# Patient Record
Sex: Female | Born: 2000
Health system: Southern US, Community
[De-identification: ages and names within clinical notes are randomized; demographics above are authoritative.]

## PROBLEM LIST (undated history)

## (undated) DIAGNOSIS — Z87440 Personal history of urinary (tract) infections: Secondary | ICD-10-CM

## (undated) DIAGNOSIS — R3129 Other microscopic hematuria: Secondary | ICD-10-CM

## (undated) DIAGNOSIS — J050 Acute obstructive laryngitis [croup]: Secondary | ICD-10-CM

## (undated) HISTORY — DX: Personal history of urinary (tract) infections: Z87.440

## (undated) HISTORY — DX: Other microscopic hematuria: R31.29

## (undated) HISTORY — PX: WISDOM TOOTH EXTRACTION: SHX21

## (undated) HISTORY — DX: Acute obstructive laryngitis (croup): J05.0

---
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
## 2020-06-10 DIAGNOSIS — Z8659 Personal history of other mental and behavioral disorders: Secondary | ICD-10-CM

## 2020-06-10 HISTORY — DX: Personal history of other mental and behavioral disorders: Z86.59

## 2020-07-03 ENCOUNTER — Encounter: Payer: Self-pay | Admitting: Family Medicine

## 2020-07-03 ENCOUNTER — Telehealth: Payer: Self-pay

## 2020-07-03 ENCOUNTER — Emergency Department
Admission: EM | Admit: 2020-07-03 | Discharge: 2020-07-04 | Disposition: A | Discharge status: Other Acute Inpatient Hospital | Payer: BC Managed Care – PPO | Attending: Emergency Medicine | Admitting: Emergency Medicine

## 2020-07-03 ENCOUNTER — Ambulatory Visit (INDEPENDENT_AMBULATORY_CARE_PROVIDER_SITE_OTHER): Payer: BC Managed Care – PPO | Admitting: Family Medicine

## 2020-07-03 ENCOUNTER — Other Ambulatory Visit: Payer: Self-pay

## 2020-07-03 VITALS — BP 118/76 | HR 94 | Temp 98.2°F | Ht 66.0 in | Wt 185.3 lb

## 2020-07-03 DIAGNOSIS — Z046 Encounter for general psychiatric examination, requested by authority: Secondary | ICD-10-CM | POA: Diagnosis not present

## 2020-07-03 DIAGNOSIS — R45851 Suicidal ideations: Secondary | ICD-10-CM

## 2020-07-03 DIAGNOSIS — Z20822 Contact with and (suspected) exposure to covid-19: Secondary | ICD-10-CM | POA: Diagnosis not present

## 2020-07-03 DIAGNOSIS — F418 Other specified anxiety disorders: Secondary | ICD-10-CM

## 2020-07-03 DIAGNOSIS — R45850 Homicidal ideations: Secondary | ICD-10-CM | POA: Insufficient documentation

## 2020-07-03 DIAGNOSIS — F419 Anxiety disorder, unspecified: Secondary | ICD-10-CM | POA: Diagnosis not present

## 2020-07-03 DIAGNOSIS — F323 Major depressive disorder, single episode, severe with psychotic features: Secondary | ICD-10-CM | POA: Diagnosis not present

## 2020-07-03 DIAGNOSIS — F810 Specific reading disorder: Secondary | ICD-10-CM | POA: Diagnosis not present

## 2020-07-03 DIAGNOSIS — R5383 Other fatigue: Secondary | ICD-10-CM

## 2020-07-03 LAB — CBC
HCT: 37.6 % (ref 36.0–46.0)
Hemoglobin: 12.3 g/dL (ref 12.0–15.0)
MCH: 28.6 pg (ref 26.0–34.0)
MCHC: 32.7 g/dL (ref 30.0–36.0)
MCV: 87.4 fL (ref 80.0–100.0)
Platelets: 372 10*3/uL (ref 150–400)
RBC: 4.3 MIL/uL (ref 3.87–5.11)
RDW: 14.5 % (ref 11.5–15.5)
WBC: 11.8 10*3/uL — ABNORMAL HIGH (ref 4.0–10.5)
nRBC: 0 % (ref 0.0–0.2)

## 2020-07-03 LAB — VITAMIN D 25 HYDROXY (VIT D DEFICIENCY, FRACTURES): VITD: 18.62 ng/mL — ABNORMAL LOW (ref 30.00–100.00)

## 2020-07-03 LAB — COMPREHENSIVE METABOLIC PANEL
ALT: 11 U/L (ref 0–35)
ALT: 15 U/L (ref 0–44)
AST: 13 U/L (ref 0–37)
AST: 18 U/L (ref 15–41)
Albumin: 4.5 g/dL (ref 3.5–5.2)
Albumin: 4.6 g/dL (ref 3.5–5.0)
Alkaline Phosphatase: 71 U/L (ref 47–119)
Alkaline Phosphatase: 67 U/L (ref 38–126)
Anion gap: 11 (ref 5–15)
BUN: 11 mg/dL (ref 6–23)
BUN: 11 mg/dL (ref 6–20)
CO2: 27 mEq/L (ref 19–32)
CO2: 25 mmol/L (ref 22–32)
Calcium: 9.6 mg/dL (ref 8.4–10.5)
Calcium: 9.4 mg/dL (ref 8.9–10.3)
Chloride: 102 mEq/L (ref 96–112)
Chloride: 101 mmol/L (ref 98–111)
Creatinine, Ser: 0.49 mg/dL (ref 0.40–1.20)
Creatinine, Ser: 0.49 mg/dL (ref 0.44–1.00)
GFR, Estimated: >60 mL/min (ref >60)
GFR: 136.5 mL/min (ref >60.00)
Glucose, Bld: 90 mg/dL (ref 70–99)
Glucose, Bld: 97 mg/dL (ref 70–99)
Potassium: 4.4 mEq/L (ref 3.5–5.1)
Potassium: 3.9 mmol/L (ref 3.5–5.1)
Sodium: 138 mEq/L (ref 135–145)
Sodium: 137 mmol/L (ref 135–145)
Total Bilirubin: 0.4 mg/dL (ref 0.2–1.2)
Total Bilirubin: 0.5 mg/dL (ref 0.3–1.2)
Total Protein: 7.5 g/dL (ref 6.0–8.3)
Total Protein: 8.3 g/dL — ABNORMAL HIGH (ref 6.5–8.1)

## 2020-07-03 LAB — CBC WITH DIFFERENTIAL/PLATELET
Basophils Absolute: 0 10*3/uL (ref 0.0–0.1)
Basophils Relative: 0.4 % (ref 0.0–3.0)
Eosinophils Absolute: 0.1 10*3/uL (ref 0.0–0.7)
Eosinophils Relative: 1.4 % (ref 0.0–5.0)
HCT: 36 % (ref 36.0–49.0)
Hemoglobin: 12 g/dL (ref 12.0–16.0)
Lymphocytes Relative: 18.1 % — ABNORMAL LOW (ref 24.0–48.0)
Lymphs Abs: 1.6 10*3/uL (ref 0.7–4.0)
MCHC: 33.3 g/dL (ref 31.0–37.0)
MCV: 86.4 fl (ref 78.0–98.0)
Monocytes Absolute: 0.5 10*3/uL (ref 0.1–1.0)
Monocytes Relative: 6.3 % (ref 3.0–12.0)
Neutro Abs: 6.3 10*3/uL (ref 1.4–7.7)
Neutrophils Relative %: 73.8 % — ABNORMAL HIGH (ref 43.0–71.0)
Platelets: 348 10*3/uL (ref 150.0–575.0)
RBC: 4.17 Mil/uL (ref 3.80–5.70)
RDW: 15.3 % (ref 11.4–15.5)
WBC: 8.6 10*3/uL (ref 4.5–13.5)

## 2020-07-03 LAB — URINE DRUG SCREEN, QUALITATIVE (ARMC ONLY)
Amphetamines, Ur Screen: NOT DETECTED
Barbiturates, Ur Screen: NOT DETECTED
Benzodiazepine, Ur Scrn: NOT DETECTED
Cannabinoid 50 Ng, Ur ~~LOC~~: NOT DETECTED
Cocaine Metabolite,Ur ~~LOC~~: NOT DETECTED
MDMA (Ecstasy)Ur Screen: NOT DETECTED
Methadone Scn, Ur: NOT DETECTED
Opiate, Ur Screen: NOT DETECTED
Phencyclidine (PCP) Ur S: NOT DETECTED
Tricyclic, Ur Screen: NOT DETECTED

## 2020-07-03 LAB — POC URINE PREG, ED: Preg Test, Ur: NEGATIVE

## 2020-07-03 LAB — RESP PANEL BY RT-PCR (FLU A&B, COVID) ARPGX2
Influenza A by PCR: NEGATIVE
Influenza B by PCR: NEGATIVE
SARS Coronavirus 2 by RT PCR: NEGATIVE

## 2020-07-03 LAB — ACETAMINOPHEN LEVEL: Acetaminophen (Tylenol), Serum: <10 ug/mL — ABNORMAL LOW (ref 10–30)

## 2020-07-03 LAB — VITAMIN B12: Vitamin B-12: 164 pg/mL — ABNORMAL LOW (ref 211–911)

## 2020-07-03 LAB — SALICYLATE LEVEL: Salicylate Lvl: <7 mg/dL — ABNORMAL LOW (ref 7.0–30.0)

## 2020-07-03 LAB — ETHANOL: Alcohol, Ethyl (B): <10 mg/dL (ref <10)

## 2020-07-03 LAB — TSH: TSH: 1.66 u[IU]/mL (ref 0.40–5.00)

## 2020-07-03 NOTE — ED Notes (Addendum)
Pt dressed out into hospital attire at this time. Pt's belongings to include: 1 pink shirt 1 white pants 1 blue hair tie 2 blue shoes  2 blue socks 1 pink panties 1 gray bra  Mom took all belongings home with her.

## 2020-07-03 NOTE — Patient Instructions (Addendum)
Labs today Go to [facility] [phone] for evaluation today - walk in available from 8am to 3pm.  [address], [place], [place] [address]  If worsening bad thoughts, go to the ER or call 911.

## 2020-07-03 NOTE — BH Assessment (Signed)
Clinical research associate received call from [place] at [facility]...  Patient has been accepted to [facility].  Patient assigned to [facility] Accepting physician is Dr. Estill Cotta.  Call report to [phone].  Representative was [employer].   ER Staff is aware of it:   Misty Stanley, ER Secretary  Dr. Vicente Males, ER MD  Pattricia Boss, Patient's Nurse

## 2020-07-03 NOTE — Telephone Encounter (Signed)
Spoke with Mori/Lori from [employer].

## 2020-07-03 NOTE — Assessment & Plan Note (Addendum)
High scores on PHQ9 and GAD7.  However endorsing possible auditory hallucinations.  Recommend urgent psychiatry referral. They will go today to [facility] for walk-in crisis service.  Mom is supportive and reliable.  Check TSH today.  I did ask them to keep me updated after evaluation.

## 2020-07-03 NOTE — Assessment & Plan Note (Addendum)
Has tested positive for this.  Endorses h/o autism spectrum and ?OCD.  No records available.

## 2020-07-03 NOTE — ED Triage Notes (Addendum)
Pt comes via [employer] with IVC paperwork. Pt admits to SI and HI thoughts. Pt states now she is afraid to be alone because she might have an heart attack.  Pt denies any drugs or alcohol use.  Mom with pt and states she has been staying in room with pt at night to comfort her. Pt sleeping ok per mom. Mom states she thinks pt needs something to help with anxiety. Mom reports pt picks at her body which is the anxiety.  Paperwork states pt wished to harm self and others wit knives. Pt had went t o [facility] after PCP advised her  To and was then IVC and sent here.  Mom with pt and voices concerns about pt being alone and left here. Mom really wanting to stay with pt. Mom advised of all procedures and policies of the behavioral area. Mom understands but does voice that she will do whatever to stay with pt.

## 2020-07-03 NOTE — Assessment & Plan Note (Signed)
Discussed locking away any dangerous home utensils or weapons. Red flags to seek ER care reviewed.  Will refer to [facility] walk-in crisis clinic today. # and address provided. They will go there immediately after office visit today. I called and spoke with Judeth Cornfield at their office.

## 2020-07-03 NOTE — ED Notes (Signed)
While doing safety rounds, pt stated to this tech, "I am scared to go to sleep. I am scared that someone is going to come in this room and strangle me. There are people walking around. I want someone at my door watching me at all times to make sure I am safe. I want to go back to the hall where I was." This tech informed pt that we would be walking in to check on her q71min and that there is a camera in her rm that someone is constantly looking at. Pt still stating, "I do not feel safe here at all. I am probably not going to go to sleep." Pt informed that if she needed something to help her calm down that it could be ordered. Pt stated, "I am scared to take any medicine." Morrie Sheldon, RN made aware of pt concerns.

## 2020-07-03 NOTE — ED Notes (Signed)
IVC  PT  GOING  TO  [facility] THE  [place]

## 2020-07-03 NOTE — Telephone Encounter (Signed)
New message    Asking for a call back to discuss office visit for today.

## 2020-07-03 NOTE — ED Provider Notes (Signed)
[facility] Emergency Department Provider Note   ____________________________________________   Event Date/Time   First MD Initiated Contact with Patient 07/03/20 1818     (approximate)  I have reviewed the triage vital signs and the nursing notes.   HISTORY  Chief Complaint SI    HPI Diana Dean is a 20 y.o. female with the below stated past medical history who presents via law enforcement with IVC paperwork after admitting to suicidal and homicidal ideation.  Patient denies any drug or alcohol use.  Patient states that she is unable to be alone and has "bad anxiety" because "I think I am going to die".  Patient currently denies any vision changes, tinnitus, difficulty speaking, facial droop, sore throat, chest pain, shortness of breath, abdominal pain, nausea/vomiting/diarrhea, dysuria, or weakness/numbness/paresthesias in any extremity         Past Medical History:  Diagnosis Date  . Croup 01/2002  . History of recurrent UTIs   . Microhematuria 11/22/2015   Reviewed urology notes - normal renal US and KUB, deemed benign persistent hematuria (2011) Suspect benign TBM (thin) disease given fmhx (father)    Patient Active Problem List   Diagnosis Date Noted  . Depression with anxiety 07/03/2020  . Homicidal ideation 07/03/2020  . Injury of left ankle 10/04/2018  . Incomplete bladder emptying 02/22/2016  . Immunization deficiency 11/24/2015  . Basic learning disability, reading 11/24/2015  . Childhood obesity 11/24/2015  . Constipation 11/24/2015  . Microhematuria 11/22/2015    History reviewed. No pertinent surgical history.  Prior to Admission medications   Not on File    Allergies Codeine and Omnicef [cefdinir]  Family History  Problem Relation Age of Onset  . Cancer Maternal Grandmother        brain tumor  . Hematuria Father        microhematuria    Social History Social History   Tobacco Use  . Smoking status: Never  Smoker  . Smokeless tobacco: Never Used  Substance Use Topics  . Alcohol use: No  . Drug use: No    Review of Systems Constitutional: No fever/chills Eyes: No visual changes. ENT: No sore throat. Cardiovascular: Denies chest pain. Respiratory: Denies shortness of breath. Gastrointestinal: No abdominal pain.  No nausea, no vomiting.  No diarrhea. Genitourinary: Negative for dysuria. Musculoskeletal: Negative for acute arthralgias Skin: Negative for rash. Neurological: Negative for headaches, weakness/numbness/paresthesias in any extremity Psychiatric:  Positive for suicidal ideation/homicidal ideation   ____________________________________________   PHYSICAL EXAM:  VITAL SIGNS: ED Triage Vitals  Enc Vitals Group     BP 07/03/20 1808 128/75     Pulse Rate 07/03/20 1808 65     Resp 07/03/20 1808 18     Temp 07/03/20 1808 98 F (36.7 C)     Temp src --      SpO2 07/03/20 1808 100 %     Weight --      Height --      Head Circumference --      Peak Flow --      Pain Score 07/03/20 1805 0     Pain Loc --      Pain Edu? --      Excl. in GC? --    Constitutional: Alert and oriented. Well appearing and in no acute distress. Eyes: Conjunctivae are normal. PERRL. Head: Atraumatic. Nose: No congestion/rhinnorhea. Mouth/Throat: Mucous membranes are moist. Neck: No stridor Cardiovascular: Grossly normal heart sounds.  Good peripheral circulation. Respiratory: Normal respiratory effort.  No retractions. Gastrointestinal: Soft and nontender. No distention. Musculoskeletal: No obvious deformities Neurologic:  Normal speech and language. No gross focal neurologic deficits are appreciated. Skin:  Skin is warm and dry. No rash noted. Psychiatric: Mood and affect are normal. Speech and behavior are normal.  ____________________________________________   LABS (all labs ordered are listed, but only abnormal results are displayed)  Labs Reviewed  COMPREHENSIVE METABOLIC PANEL  - Abnormal; Notable for the following components:      Result Value   Total Protein 8.3 (*)    All other components within normal limits  SALICYLATE LEVEL - Abnormal; Notable for the following components:   Salicylate Lvl <7.0 (*)    All other components within normal limits  ACETAMINOPHEN LEVEL - Abnormal; Notable for the following components:   Acetaminophen (Tylenol), Serum <10 (*)    All other components within normal limits  CBC - Abnormal; Notable for the following components:   WBC 11.8 (*)    All other components within normal limits  RESP PANEL BY RT-PCR (FLU A&B, COVID) ARPGX2  ETHANOL  URINE DRUG SCREEN, QUALITATIVE ([facility] ONLY)  POC URINE PREG, ED   PROCEDURES  Procedure(s) performed (including Critical Care):  Procedures   ____________________________________________   INITIAL IMPRESSION / ASSESSMENT AND PLAN / ED COURSE  As part of my medical decision making, I reviewed the following data within the electronic MEDICAL RECORD NUMBER Nursing notes reviewed and incorporated, Labs reviewed, EKG interpreted, Old chart reviewed, Radiograph reviewed and Notes from prior ED visits reviewed and incorporated        Thoughts are linear and organized, and patient has no AH, VH. Prior suicide attempt: Denies Prior Psychiatric Hospitalizations: Denies  Clinically patient displays no overt toxidrome; they are well appearing, with low suspicion for toxic ingestion given history and exam. Thoughts unlikely 2/2 anemia, hypothyroidism, infection, or ICH.  Consult: Psychiatry to evaluate patient for potential hold for danger to self. Disposition: Plan admit to psychiatry for further management of symptoms.      ____________________________________________   FINAL CLINICAL IMPRESSION(S) / ED DIAGNOSES  Final diagnoses:  Suicidal ideation  Homicidal ideation     ED Discharge Orders    None       Note:  This document was prepared using Dragon voice recognition  software and may include unintentional dictation errors.   Merwyn Katos, MD 07/03/20 [phone]

## 2020-07-03 NOTE — Progress Notes (Signed)
Patient ID: Diana Dean, female    DOB: 2000-02-16, 20 y.o.   MRN: [id number]  This visit was conducted in person.  BP 118/76   Pulse 94   Temp 98.2 F (36.8 C) (Temporal)   Ht 5\' 6"  (1.676 m)   Wt 185 lb 5 oz (84.1 kg)   LMP 06/04/2020   SpO2 100%   BMI 29.91 kg/m    CC: depression  Subjective:   HPI: Diana Dean is a 20 y.o. female presenting on 07/03/2020 for Depression (Pt accompanied by mom, [name]- temp 98.4.)   Last seen 2020. Here with mom today.  Over the past week having bad thoughts - homicidal thoughts towards family members - fixating on it and unable to stop thinking about it. Fleeting thoughts about plan (knives).  She hears voices telling her to do things she doesn't want to do.  No visual hallucinations.  No manic symptoms.   She thinks the devil has placed these thoughts in her had. No new stressors, life event change.  She sees 2021 with benefit.  Mom notes she is normally a very loving person.   Notes worsening mood trouble puberty started (age 85-12yo).  Remotely saw psychiatrist - for chronic bladder issues.  Has been pinching her face for years.   Endorses h/o autism and OCD - never formally diagnosed.  No fmhx mental health illness.  Endorses some fatigue.      Relevant past medical, surgical, family and social history reviewed and updated as indicated. Interim medical history since our last visit reviewed. Allergies and medications reviewed and updated. No outpatient medications prior to visit.   No facility-administered medications prior to visit.     Per HPI unless specifically indicated in ROS section below Review of Systems Objective:  BP 118/76   Pulse 94   Temp 98.2 F (36.8 C) (Temporal)   Ht 5\' 6"  (1.676 m)   Wt 185 lb 5 oz (84.1 kg)   LMP 06/04/2020   SpO2 100%   BMI 29.91 kg/m   Wt Readings from Last 3 Encounters:  07/03/20 185 lb 5 oz (84.1 kg) (96 %, Z= 1.70)*  03/29/16 193 lb 1.9 oz (87.6 kg) (98  %, Z= 2.05)*  02/22/16 192 lb 8 oz (87.3 kg) (98 %, Z= 2.06)*   * Growth percentiles are based on CDC (Girls, 2-20 Years) data.      Physical Exam Vitals and nursing note reviewed.  Constitutional:      Appearance: Normal appearance. She is not ill-appearing.  Neck:     Thyroid: No thyroid mass or thyromegaly.  Cardiovascular:     Rate and Rhythm: Normal rate and regular rhythm.     Pulses: Normal pulses.     Heart sounds: Normal heart sounds. No murmur heard.   Pulmonary:     Effort: Pulmonary effort is normal. No respiratory distress.     Breath sounds: Normal breath sounds. No wheezing, rhonchi or rales.  Musculoskeletal:     Left lower leg: No edema.  Skin:    General: Skin is warm and dry.     Findings: No rash.  Neurological:     Mental Status: She is alert.  Psychiatric:        Mood and Affect: Mood normal.        Behavior: Behavior normal.        Depression screen [facility] 2/9 07/03/2020 11/01/2015  Decreased Interest 3 0  Down, Depressed, Hopeless 3 0  PHQ - 2  Score 6 0  Altered sleeping 1 -  Tired, decreased energy 3 -  Change in appetite 2 -  Feeling bad or failure about yourself  3 -  Trouble concentrating 3 -  Moving slowly or fidgety/restless 3 -  Suicidal thoughts 2 -  PHQ-9 Score 23 -    GAD 7 : Generalized Anxiety Score 07/03/2020  Nervous, Anxious, on Edge 3  Control/stop worrying 3  Worry too much - different things 3  Trouble relaxing 3  Restless 1  Easily annoyed or irritable 2  Afraid - awful might happen 3  Total GAD 7 Score 18   Assessment & Plan:  This visit occurred during the SARS-CoV-2 public health emergency.  Safety protocols were in place, including screening questions prior to the visit, additional usage of staff PPE, and extensive cleaning of exam room while observing appropriate contact time as indicated for disinfecting solutions.   Problem List Items Addressed This Visit    Basic learning disability, reading    Has tested  positive for this.  Endorses h/o autism spectrum and ?OCD.  No records available.       Depression with anxiety - Primary    High scores on PHQ9 and GAD7.  However endorsing possible auditory hallucinations.  Recommend urgent psychiatry referral. They will go today to [facility] for walk-in crisis service.  Mom is supportive and reliable.  Check TSH today.  I did ask them to keep me updated after evaluation.       Homicidal ideation    Discussed locking away any dangerous home utensils or weapons. Red flags to seek ER care reviewed.  Will refer to [facility] walk-in crisis clinic today. # and address provided. They will go there immediately after office visit today. I called and spoke with Judeth Cornfield at their office.        Other Visit Diagnoses    Fatigue, unspecified type       Relevant Orders   Comprehensive metabolic panel   TSH   CBC with Differential/Platelet   VITAMIN D 25 Hydroxy (Vit-D Deficiency, Fractures)   Vitamin B12       No orders of the defined types were placed in this encounter.  Orders Placed This Encounter  Procedures  . Comprehensive metabolic panel  . TSH  . CBC with Differential/Platelet  . VITAMIN D 25 Hydroxy (Vit-D Deficiency, Fractures)  . Vitamin B12    Patient Instructions  Labs today Go to [facility] [phone] for evaluation today - walk in available from 8am to 3pm.  [address], [place], [place] [address]  If worsening bad thoughts, go to the ER or call 911.   Follow up plan: Return if symptoms worsen or fail to improve.  Eustaquio Boyden, MD

## 2020-07-03 NOTE — Telephone Encounter (Signed)
I returned Mori's call.  She requests call back from Dr. Reece Agar to get some details of today's OV and to answer a few questions.

## 2020-07-04 DIAGNOSIS — F323 Major depressive disorder, single episode, severe with psychotic features: Secondary | ICD-10-CM | POA: Diagnosis not present

## 2020-07-04 DIAGNOSIS — R3129 Other microscopic hematuria: Secondary | ICD-10-CM | POA: Diagnosis not present

## 2020-07-04 DIAGNOSIS — Z87440 Personal history of urinary (tract) infections: Secondary | ICD-10-CM | POA: Diagnosis not present

## 2020-07-04 DIAGNOSIS — F339 Major depressive disorder, recurrent, unspecified: Secondary | ICD-10-CM | POA: Diagnosis not present

## 2020-07-04 DIAGNOSIS — Z560 Unemployment, unspecified: Secondary | ICD-10-CM | POA: Diagnosis not present

## 2020-07-04 DIAGNOSIS — R45851 Suicidal ideations: Secondary | ICD-10-CM | POA: Diagnosis not present

## 2020-07-04 DIAGNOSIS — R45850 Homicidal ideations: Secondary | ICD-10-CM | POA: Diagnosis not present

## 2020-07-04 DIAGNOSIS — F200 Paranoid schizophrenia: Secondary | ICD-10-CM | POA: Diagnosis not present

## 2020-07-04 DIAGNOSIS — Z9152 Personal history of nonsuicidal self-harm: Secondary | ICD-10-CM | POA: Diagnosis not present

## 2020-07-04 DIAGNOSIS — Z87898 Personal history of other specified conditions: Secondary | ICD-10-CM | POA: Diagnosis not present

## 2020-07-04 NOTE — ED Notes (Signed)
Transport services given 2/2 bags (one from mom brought this morning).

## 2020-07-04 NOTE — ED Notes (Signed)
EMTALA reviewed by this RN.  

## 2020-07-04 NOTE — ED Notes (Signed)
Pt's family members came to drop off pt's personal belongings. Family members were very upset because pt is not allow to wear her personal clothes when she gets transfer to another facility. This tech informed them that we provide pt with our beh scrubs and daily needs. Pt's mother was very upset due to the weather and pt is not allow to wear a jacket.

## 2020-07-04 NOTE — ED Notes (Signed)
This tech attempted to call both number provided to [place] to contact pt's mother. Both attempt were unsuccessful.

## 2020-07-04 NOTE — ED Provider Notes (Signed)
Emergency Medicine Observation Re-evaluation Note  Diana Dean is a 20 y.o. female, seen on rounds today.  Pt initially presented to the ED for complaints of SI Currently, the patient is resting comfortably.  Physical Exam  BP 124/79 (BP Location: Left Arm)   Pulse 92   Temp 98.3 F (36.8 C) (Oral)   Resp 16   LMP 06/04/2020   SpO2 99%  Physical Exam Constitutional:      Appearance: She is not ill-appearing or toxic-appearing.  HENT:     Head: Atraumatic.  Cardiovascular:     Comments: Well perfused Pulmonary:     Effort: Pulmonary effort is normal.  Abdominal:     General: There is no distension.  Musculoskeletal:        General: No deformity.  Skin:    Findings: No rash.  Neurological:     General: No focal deficit present.     Cranial Nerves: No cranial nerve deficit.      ED Course / MDM  EKG:   I have reviewed the labs performed to date as well as medications administered while in observation.  Recent changes in the last 24 hours include accepted to [facility].  Plan  Current plan is for transfer to [facility] this AM. Patient is under full IVC at this time.   Delton Prairie, MD 07/04/20 [phone]

## 2020-07-05 ENCOUNTER — Telehealth: Payer: Self-pay

## 2020-07-05 NOTE — Telephone Encounter (Signed)
Received a call from patient's grandmother, Diana Dean, very upset in regards to the patient. Per Diana Dean patient came to see Dr Reece Agar on 07/01/20 and Dr Reece Agar sent patient to [facility] and then patient went to ER at [facility] and then patient was transferred to [facility] and was committed. Per Diana Dean " Dr Reece Agar is the one that got this started and sent patient to the crazy house," "patient is not crazy, all she did was been honest with Dr Reece Agar and this is where it got her," "patient would never do anything to hurt someone on this earth," "patient has some aspect of autism," " Dr Reece Agar is killing her with this," " She is stuck in the room with another person, patient can not have anything with her not even bible, she can not speak or see her mother," can not believe patient is getting this hellish treatment from our office," " wish patient never came to our office," " We will leave no stone unturned until something is done," " Wants Dr Reece Agar to call the [facility] place and get patient out of this, do not want explanation or understanding but want something done today!" " she is not crazy all she needed was medication to help her," " I thought Dr Reece Agar was the most compassion person ever but after this can not believe he would do this to the patient, she will never recover from this!"  All the "" notes" came from the grandmother. Spoke with Carollee Herter RN and was asked to take a message down about this. Grandmother is aware that Dr Reece Agar is not in the office today.   CB to Diana Dean is [phone]

## 2020-07-05 NOTE — Telephone Encounter (Signed)
I called and spoke with mother [name] regarding recent events.  I have not called grandma.  Mom has visitation planned with Toni Amend tomorrow at 11am.  I will try and touch base with psych team at [facility] tomorrow [phone]) [phone].

## 2020-07-06 NOTE — Telephone Encounter (Addendum)
Mother [name] called back, pt has signed a released at the facility where she is at so mother said PCP should be able to call psychiatrist back asap, mother request PCP to call and then update her when able she is very concerned regarding pt  Mother request cb on cell # [phone]

## 2020-07-06 NOTE — Telephone Encounter (Signed)
I spoke with parents.

## 2020-07-06 NOTE — Telephone Encounter (Signed)
Called Diana Dean.  Treatment team has left.  Left my contact info asking for a call back tomorrow morning.

## 2020-07-06 NOTE — Telephone Encounter (Signed)
I called and spoke with operator - who states patient needs to sign release of information form so I can speak with psychiatrist. I gave my contact info.  I spoke with mom about above.

## 2020-07-07 ENCOUNTER — Encounter: Payer: Self-pay | Admitting: Family Medicine

## 2020-07-07 DIAGNOSIS — E538 Deficiency of other specified B group vitamins: Secondary | ICD-10-CM | POA: Insufficient documentation

## 2020-07-07 DIAGNOSIS — E559 Vitamin D deficiency, unspecified: Secondary | ICD-10-CM | POA: Insufficient documentation

## 2020-07-10 ENCOUNTER — Telehealth: Payer: Self-pay

## 2020-07-10 NOTE — Telephone Encounter (Signed)
[facility] Night - Client TELEPHONE ADVICE RECORD [facility] Patient Name: Diana Dean Gender: Female DOB: 09-12-2000 Age: 20 Y 6 M 28 D Return Phone Number: [phone] (Primary), [phone] (Secondary) Address: City/ State/ Zip: [place] [address] Client [facility] Night - Client Client Site [facility] - Night Physician Eustaquio Boyden - MD Contact Type Call Who Is Calling Patient / Member / Family / Caregiver Call Type Triage / Clinical Caller Name Atia Haupt Relationship To Patient Father Return Phone Number [phone] (Primary) Chief Complaint Fever (non urgent symptom) (> THREE MONTHS) Reason for Call Symptomatic / Request for Health Information Initial Comment Caller states that his daughter is at [facility] and is running a fever. Caller states that his daughter is not being treated right. Caller thinks that his daughter is being neglected. Translation No Disp. Time Lamount Cohen Time) Disposition Final User 07/09/2020 9:48:19 AM Paged On Call back to [facility], [place], Mariane Duval 07/09/2020 10:05:43 AM Paged On Call back to [facility], [place], Mariane Duval 07/09/2020 10:33:36 AM Paged On Call back to [facility], [place], Mariane Duval 07/09/2020 10:37:44 AM Clinical Call Yes Sande Brothers, RN, Mariane Duval Caller Disagree/Comply Comply Caller Understands Yes PreDisposition Call Doctor Comments User: Sherlynn Carbon, RN Date/Time Lamount Cohen Time): 07/09/2020 9:43:28 AM Consulting with charge nurse. Advising parent will send chart to MD. Advising if emergency may return call. User: Sherlynn Carbon, RN Date/Time Lamount Cohen Time): 07/09/2020 10:37:19 AM Father stating that the special council member Marylene Land is with patient now. Stating also, that the doctor at the hospital will be seeing patient shortly. PLEASE NOTE: All timestamps contained within this report are represented as [place] Standard  Time. CONFIDENTIALTY NOTICE: This fax transmission is intended only for the addressee. It contains information that is legally privileged, confidential or otherwise protected from use or disclosure. If you are not the intended recipient, you are strictly prohibited from reviewing, disclosing, copying using or disseminating any of this information or taking any action in reliance on or regarding this information. If you have received this fax in error, please notify us immediately by telephone so that we can arrange for its return to [place]. Phone: [phone], Toll-Free: [phone], Fax: [phone] Page: 2 of 2 Call Id: [id number] Paging DoctorName Phone DateTime Result/ Outcome Message Type Notes Sanda Linger - MD [phone] 07/09/2020 9:48:19 AM Paged On Call Back to Call Center Doctor Paged Please return call regarding 20 yo female. Thank you. Sanda Linger - MD [phone] 07/09/2020 10:05:43 AM Paged On Call Back to Call Center Doctor Paged Paging from Access Nurse with [facility] regarding a patient of Dr. Sharen Hones. Please advise. Thanks. Sanda Linger - MD [phone] 07/09/2020 10:33:36 AM Paged On Call Back to Call Center Doctor Paged Sanda Linger - MD 07/09/2020 10:34:38 AM Spoke with On Call - General Message Result MD advising that he is unable to intervene at this time. Stating send chart to Dr. Sharen Hones so that he can address tomorrow when he is in the office. This nurse returning call to parents to notify of same with understanding verbalized.

## 2020-07-10 NOTE — Telephone Encounter (Signed)
Noted. Thanks.

## 2020-07-12 ENCOUNTER — Telehealth: Payer: Self-pay | Admitting: Family Medicine

## 2020-07-12 DIAGNOSIS — R45850 Homicidal ideations: Secondary | ICD-10-CM

## 2020-07-12 DIAGNOSIS — F418 Other specified anxiety disorders: Secondary | ICD-10-CM

## 2020-07-12 DIAGNOSIS — R440 Auditory hallucinations: Secondary | ICD-10-CM

## 2020-07-12 NOTE — Telephone Encounter (Signed)
Mom mrs. Posadas called and asked if Dr. Reece Agar can write a referral to Dr. Jennelle Human at [facility], they will not take anyone without a referral. And if he is not taking anyone then she stated that she would want someone at [facility] but she really want him.  This is an urgent referral

## 2020-07-16 NOTE — Addendum Note (Signed)
Addended by: Eustaquio Boyden on: 07/16/2020 10:07 AM   Modules accepted: Orders

## 2020-07-16 NOTE — Telephone Encounter (Signed)
I never received initial message. New referral placed.  I'm glad she's doing better. Ok to cancel appt with me - agree with prioritizing psych eval first.  I would like them to update me after visit with Dr Jennelle Human.

## 2020-07-16 NOTE — Telephone Encounter (Signed)
Ms Stech, mom, advised. Appointment cancelled for tomorrow. Advised mom that I will call her back once we are able to fax over this referral

## 2020-07-16 NOTE — Telephone Encounter (Signed)
Ms Kam called back to check on this. Patient has an appointment scheduled for tomorrow for hospital follow up with Dr Reece Agar but does not feel like she needs to come in because patient just needs a referral to see psych-see note below. Patient is doing better. I have not cancelled appointment for tomorrow yet until Dr Reece Agar lets me know this is ok.  If it is ok to cancel need to call mom back to confirm with her.   Please call Ms Colberg once referral is putting to [facility] other note below. Ms Felipe is aware once this is done she can call their office and go ahead and schedule.  Dr Reece Agar please route this to me so I can follow up on this since I am aware of the situation. Mom called on 07/12/20 with referral request but it does not look like the note got routed to your box. Thank you

## 2020-07-17 ENCOUNTER — Inpatient Hospital Stay: Payer: BC Managed Care – PPO | Admitting: Family Medicine

## 2020-07-18 NOTE — Telephone Encounter (Signed)
Left message for [name] letting her know referral was faxed over as requested and to call me back with any concerns or issues with this.

## 2020-07-21 ENCOUNTER — Encounter: Payer: Self-pay | Admitting: Family Medicine

## 2020-07-21 ENCOUNTER — Other Ambulatory Visit: Payer: Self-pay | Admitting: Family Medicine

## 2020-07-21 DIAGNOSIS — F418 Other specified anxiety disorders: Secondary | ICD-10-CM

## 2020-07-21 MED ORDER — VITAMIN D3 25 MCG (1000 UT) PO CAPS: 1.0000 | ORAL_CAPSULE | Freq: Every day | ORAL | Status: AC

## 2020-07-21 MED ORDER — B-12 1000 MCG SL SUBL: 1.0000 | SUBLINGUAL_TABLET | Freq: Every day | SUBLINGUAL | Status: DC

## 2020-07-21 NOTE — Addendum Note (Signed)
Addended by: Eustaquio Boyden on: 07/21/2020 11:49 AM   Modules accepted: Orders

## 2020-08-01 ENCOUNTER — Other Ambulatory Visit: Payer: Self-pay

## 2020-08-01 ENCOUNTER — Ambulatory Visit (INDEPENDENT_AMBULATORY_CARE_PROVIDER_SITE_OTHER): Payer: Self-pay | Admitting: Adult Health

## 2020-08-01 DIAGNOSIS — F411 Generalized anxiety disorder: Secondary | ICD-10-CM

## 2020-08-01 DIAGNOSIS — F428 Other obsessive-compulsive disorder: Secondary | ICD-10-CM | POA: Diagnosis not present

## 2020-08-01 NOTE — Progress Notes (Signed)
[facility] MD/PA/NP Initial Note  08/02/2020 8:06 AM Diana Dean  MRN:  [id number]  Chief Complaint:   HPI:   Patient referral from [facility].  Accompanied by mother.  Describes mood today as "ok". Pleasant. Denies tearfulness. Mood symptoms - depression - "sometimes a little", anxiety - "at times - picking at skin", and irritability - "more over the past few years". Over thinks things a "lot". Stating "that's why I went to the hospital". Has never been away from her family. Was upset after having some bad thoughts that kept getting worse. Stating "I never want to do that again". Gets bad thoughts sometimes and let's them go eventually. Stating "my bad thoughts have decreased". Doesn't understand why she thinks like that. Has fears that tend to go to the extreme - headaches - cerebral hemorrhaege. Gets scared and worried. Stating "I didn't do as well this time". Afraid of losing perople. Wrote a song about her family. Did not want to lose them. Felt like she be able to let them go but couldn't. Thoughts have subsided - feelings still there. Increased deaths in the family. Trevor Mace not doing well. Had Covid in 2020 - effected family members tremendously. Stable interest and motivation. Taking medications as prescribed.  Energy levels stable - would like for it to be better. Active, does not have a regular exercise routine.   Enjoys some usual interests and activities. Single. Lives at home with parents. Grandparents live [place] door[place] Spending time with family. Appetite adequate. Weight stable - 192 pounds. Sleeps well most nights - stays up a little later some nights. Averages 8 hours. Reports some napping during the day. Focus and concentration difficulties. Had challenges in school. Mind wandering.Completing tasks. Managing aspects of household. Has 4 chickens - started out with 12. Denies SI or HI. Feels bad inside. Weird and messed up.  Denies AH or VH. Never tested for ADHD,  but feels she does have it. Signs of Autism - never tested. Picking at skin.  High school graduate - mother home schooled.  Mother has returned to work 3 days a week. Leaves chores for patient to do.  Previous medication trials: Olanzapine  Visit Diagnosis:    ICD-10-CM   1. Obsessional thoughts  F42.8     2. Generalized anxiety disorder  F41.1       Past Psychiatric History: Recent hospitalization for homicdal thoughts. Seen at [facility] ED and transferred to [facility] for treatment. Started on Zyprexa and Hydroxyzine.  Past Medical History:  Past Medical History:  Diagnosis Date   Croup 01/2002   History of behavioral and mental health problems 06/2020   behavioral health hospitalization at [facility] IVC 7d for HI, auditory hallucinations started on zyprexa/hydroxyzine   History of recurrent UTIs    Microhematuria 11/22/2015   Reviewed urology notes - normal renal US and KUB, deemed benign persistent hematuria (2011) Suspect benign TBM (thin) disease given fmhx (father)   No past surgical history on file.  Family Psychiatric History: Denies any family history of mental illness.   Family History:  Family History  Problem Relation Age of Onset   Cancer Maternal Grandmother        brain tumor   Hematuria Father        microhematuria    Social History:  Social History   Socioeconomic History   Marital status: Single    Spouse name: Not on file   Number of children: Not on file   Years of  education: Not on file   Highest education level: Not on file  Occupational History   Not on file  Tobacco Use   Smoking status: Never   Smokeless tobacco: Never  Substance and Sexual Activity   Alcohol use: No   Drug use: No   Sexual activity: Not on file  Other Topics Concern   Not on file  Social History Narrative   Home schooled   Lives with parents Chrissie Noa and [place])   Social Determinants of Health   Financial Resource Strain: Not on file  Food Insecurity: Not on  file  Transportation Needs: Not on file  Physical Activity: Not on file  Stress: Not on file  Social Connections: Not on file    Allergies:  Allergies  Allergen Reactions   Codeine Nausea Only   Omnicef [Cefdinir] Diarrhea    Metabolic Disorder Labs: No results found for: HGBA1C, MPG No results found for: PROLACTIN No results found for: CHOL, TRIG, HDL, CHOLHDL, VLDL, LDLCALC Lab Results  Component Value Date   TSH 1.66 07/03/2020    Therapeutic Level Labs: No results found for: LITHIUM No results found for: VALPROATE No components found for:  CBMZ  Current Medications: Current Outpatient Medications  Medication Sig Dispense Refill   Cholecalciferol (VITAMIN D3) 25 MCG (1000 UT) CAPS Take 1 capsule (1,000 Units total) by mouth daily. 30 capsule    Cyanocobalamin (B-12) 1000 MCG SUBL Place 1 tablet under the tongue daily.     hydrOXYzine (VISTARIL) 25 MG capsule Take 1 capsule (25 mg total) by mouth every 8 (eight) hours as needed.     No current facility-administered medications for this visit.    Medication Side Effects: none  Orders placed this visit:  No orders of the defined types were placed in this encounter.   Psychiatric Specialty Exam:  Review of Systems  There were no vitals taken for this visit.There is no height or weight on file to calculate BMI.  General Appearance: Casual and Neat  Eye Contact:  Good  Speech:  Clear and Coherent and Normal Rate  Volume:  Normal  Mood:  Anxious  Affect:  Appropriate and Congruent  Thought Process:  Coherent and Descriptions of Associations: Intact  Orientation:  Full (Time, Place, and Person)  Thought Content: Logical   Suicidal Thoughts:  No  Homicidal Thoughts:  No  Memory:  WNL  Judgement:  Good  Insight:  Good  Psychomotor Activity:  Normal  Concentration:  Concentration: Good  Recall:  Good  Fund of Knowledge: Good  Language: Good  Assets:  Communication Skills Desire for Improvement Financial  Resources/Insurance Housing Intimacy Leisure Time Physical Health Resilience Social Support Talents/Skills Transportation Vocational/Educational  ADL's:  Intact  Cognition: WNL  Prognosis:  Good   Screenings:  GAD-7    Flowsheet Row Office Visit from 07/03/2020 in [facility]  Total GAD-7 Score 18      PHQ2-9    Flowsheet Row Office Visit from 07/03/2020 in [facility] [place] Office Visit from 11/01/2015 in Primary Care at [facility] Total Score 6 0  PHQ-9 Total Score 23 --      Flowsheet Row ED [facility] 07/03/2020 in [facility]  C-SSRS RISK CATEGORY High Risk       Receiving Psychotherapy: No   Treatment Plan/Recommendations: Plan:  PDMP reviewed  D/C Zyprexa - patient no longer taking Continue Hydroxyzine 25mg  TID prn anxiety  Also taking supplements - B, D,  Consider Zoloft for obsessional thoughts   Time spent with patient was 60 minutes. Greater than 50% of face to face time with patient was spent on counseling and coordination of care.   RTC 4 weeks  Patient advised to contact office with any questions, adverse effects, or acute worsening in signs and symptoms.     Dorothyann Gibbs, NP

## 2020-08-02 ENCOUNTER — Encounter: Payer: Self-pay | Admitting: Adult Health

## 2020-09-14 ENCOUNTER — Ambulatory Visit: Payer: Self-pay | Admitting: Adult Health

## 2020-11-06 IMAGING — DX LEFT ANKLE COMPLETE - 3+ VIEW
3 series · 3 of 3 positions shown · non-contrast
Comparison: None.

CLINICAL DATA: L ankle injury after fall yesterday

EXAM:
LEFT ANKLE COMPLETE - 3+ VIEW

[ankle ap]
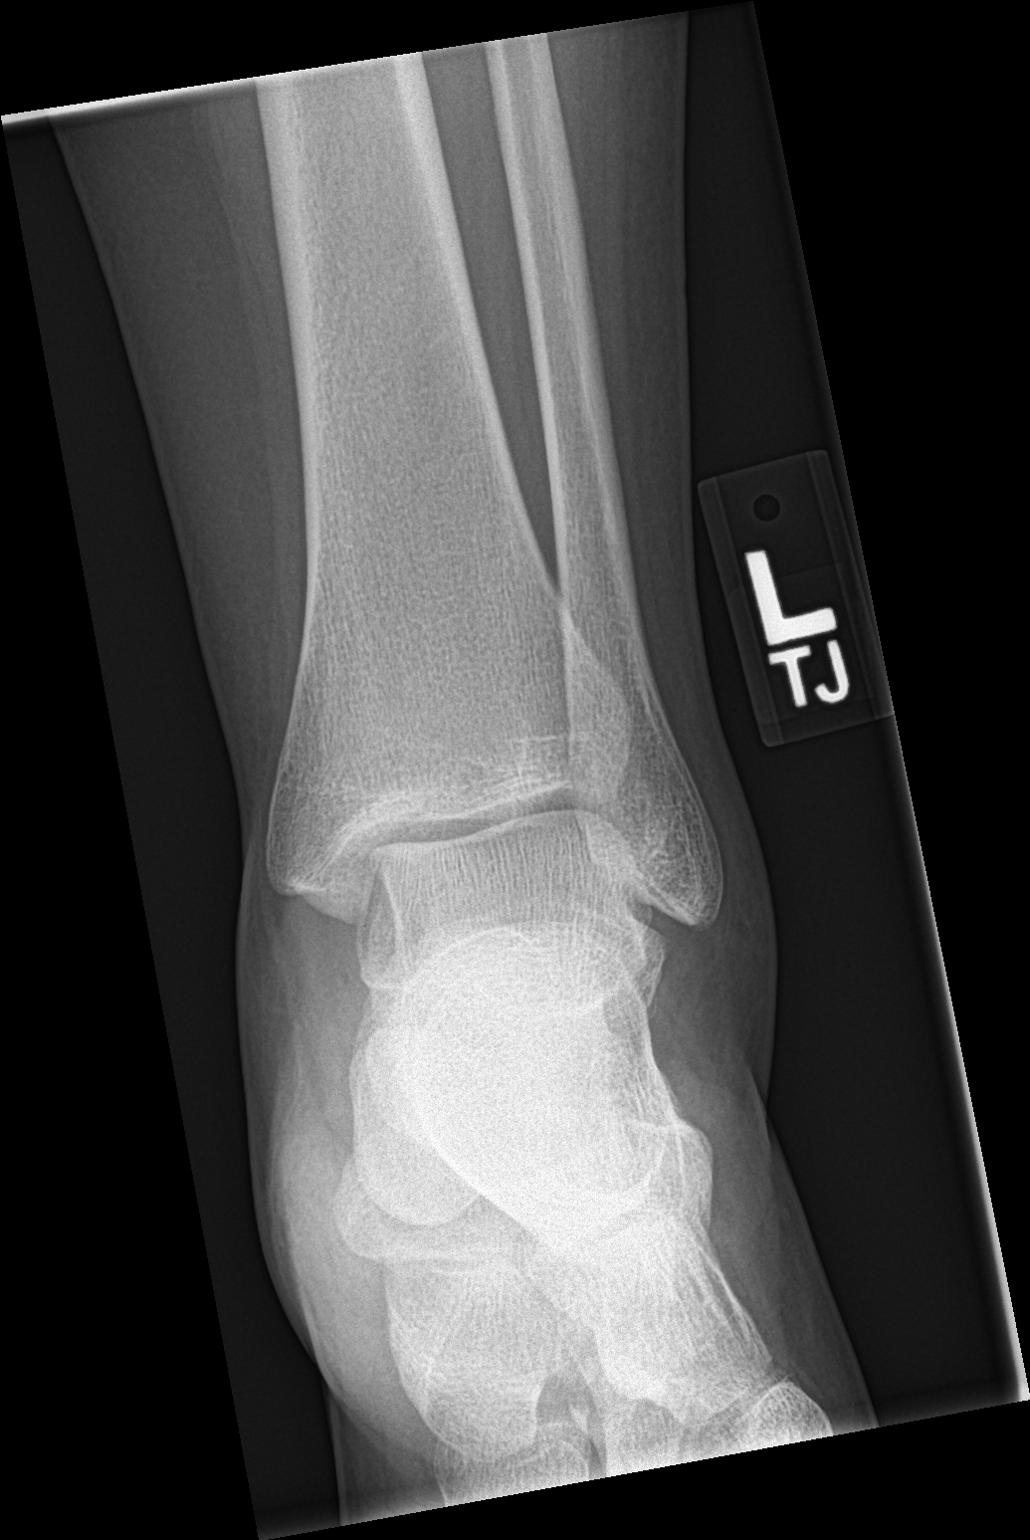

[ankle obl]
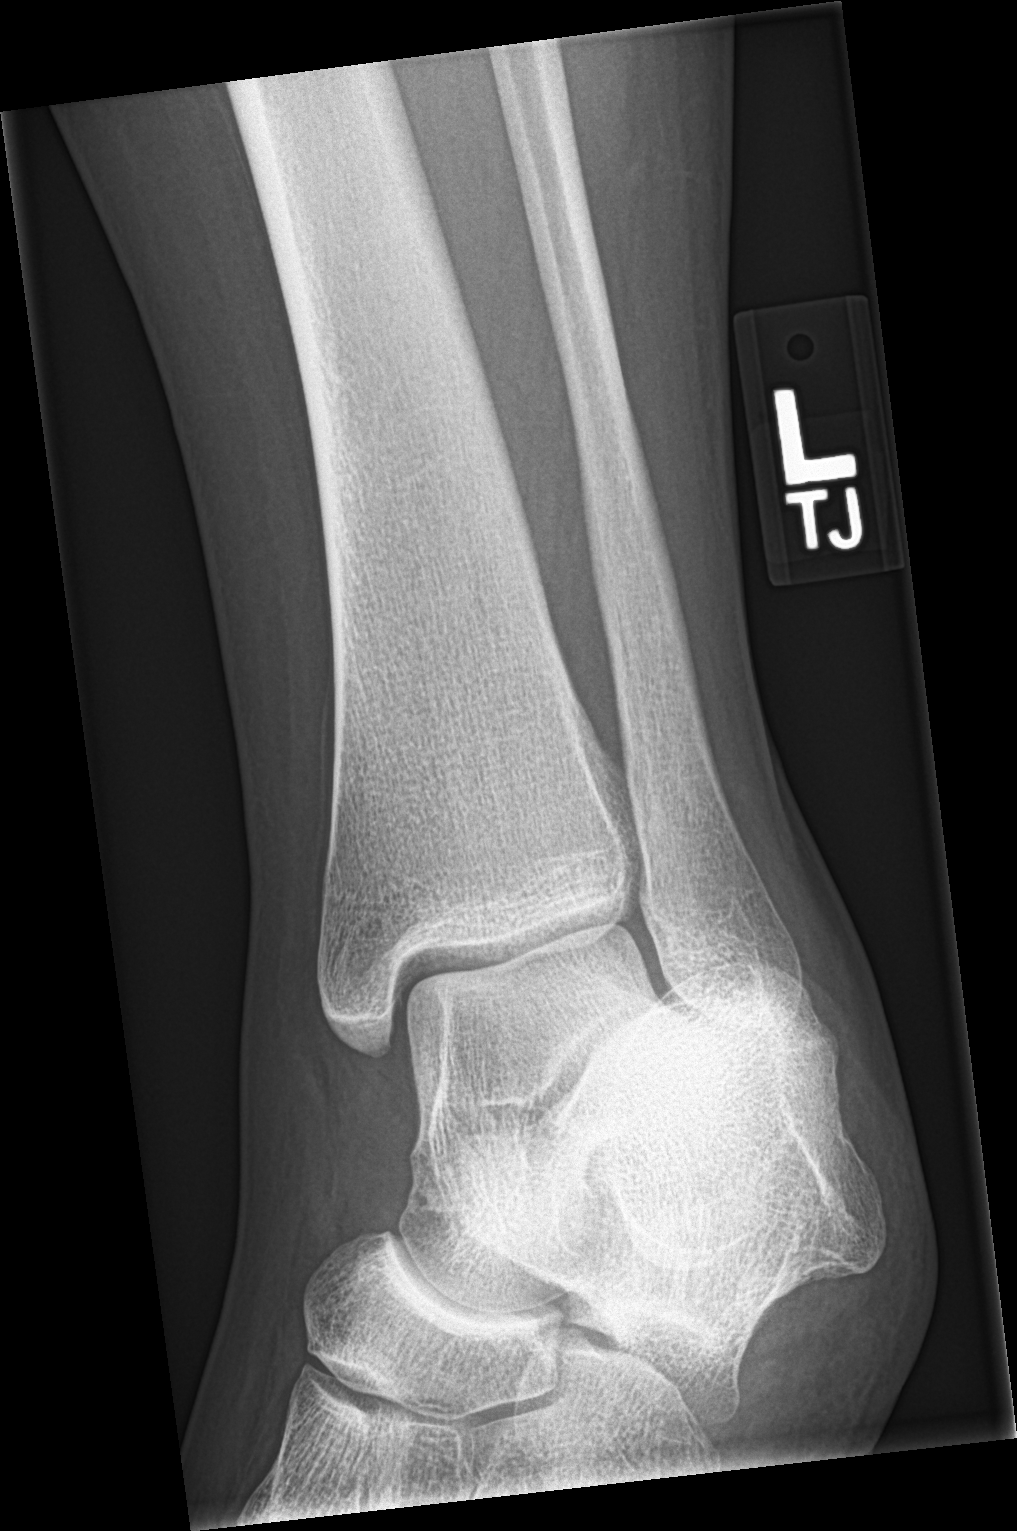

[ankle lat]
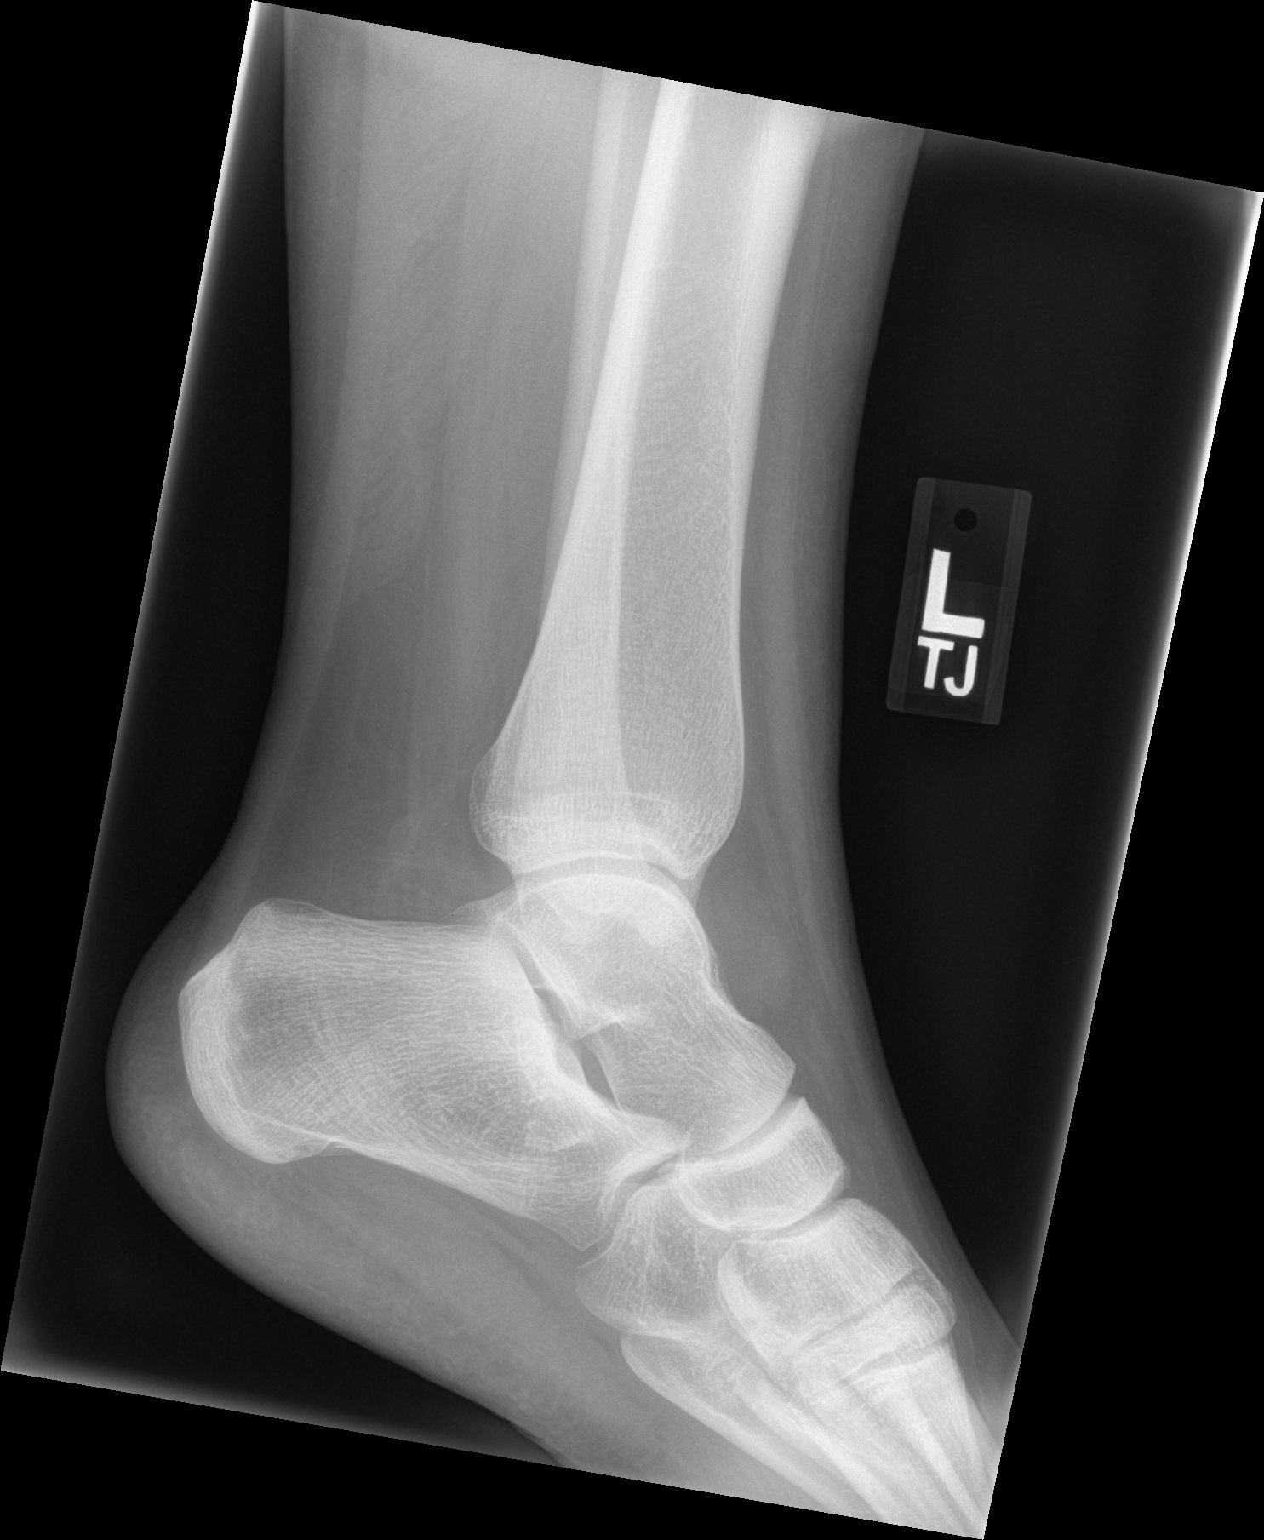

[3 of 3 positions shown; findings below may reference images not displayed]

FINDINGS: There is no evidence of fracture or dislocation. Ankle mortise is
intact. There is no evidence of arthropathy or other focal bone
abnormality. There is soft tissue swelling.
IMPRESSION: No acute osseous abnormality in the left ankle.

## 2022-08-19 ENCOUNTER — Ambulatory Visit (INDEPENDENT_AMBULATORY_CARE_PROVIDER_SITE_OTHER): Payer: BC Managed Care – PPO | Admitting: Family Medicine

## 2022-08-19 ENCOUNTER — Encounter: Payer: Self-pay | Admitting: Family Medicine

## 2022-08-19 VITALS — BP 112/68 | HR 80 | Temp 97.4°F | Ht 66.0 in | Wt 202.4 lb

## 2022-08-19 DIAGNOSIS — E538 Deficiency of other specified B group vitamins: Secondary | ICD-10-CM | POA: Diagnosis not present

## 2022-08-19 DIAGNOSIS — M545 Low back pain, unspecified: Secondary | ICD-10-CM | POA: Diagnosis not present

## 2022-08-19 DIAGNOSIS — E559 Vitamin D deficiency, unspecified: Secondary | ICD-10-CM

## 2022-08-19 DIAGNOSIS — K921 Melena: Secondary | ICD-10-CM

## 2022-08-19 DIAGNOSIS — R1030 Lower abdominal pain, unspecified: Secondary | ICD-10-CM | POA: Diagnosis not present

## 2022-08-19 DIAGNOSIS — N390 Urinary tract infection, site not specified: Secondary | ICD-10-CM | POA: Diagnosis not present

## 2022-08-19 DIAGNOSIS — R3129 Other microscopic hematuria: Secondary | ICD-10-CM | POA: Diagnosis not present

## 2022-08-19 DIAGNOSIS — R339 Retention of urine, unspecified: Secondary | ICD-10-CM | POA: Diagnosis not present

## 2022-08-19 DIAGNOSIS — K5900 Constipation, unspecified: Secondary | ICD-10-CM

## 2022-08-19 LAB — COMPREHENSIVE METABOLIC PANEL
ALT: 24 U/L (ref 0–35)
AST: 18 U/L (ref 0–37)
Albumin: 4.3 g/dL (ref 3.5–5.2)
Alkaline Phosphatase: 76 U/L (ref 39–117)
BUN: 15 mg/dL (ref 6–23)
CO2: 28 mEq/L (ref 19–32)
Calcium: 9.6 mg/dL (ref 8.4–10.5)
Chloride: 104 mEq/L (ref 96–112)
Creatinine, Ser: 0.59 mg/dL (ref 0.40–1.20)
GFR: 128.59 mL/min (ref >60.00)
Glucose, Bld: 107 mg/dL — ABNORMAL HIGH (ref 70–99)
Potassium: 4 mEq/L (ref 3.5–5.1)
Sodium: 140 mEq/L (ref 135–145)
Total Bilirubin: 0.3 mg/dL (ref 0.2–1.2)
Total Protein: 7.2 g/dL (ref 6.0–8.3)

## 2022-08-19 LAB — VITAMIN B12: Vitamin B-12: 396 pg/mL (ref 211–911)

## 2022-08-19 LAB — CBC WITH DIFFERENTIAL/PLATELET
Basophils Absolute: 0.1 10*3/uL (ref 0.0–0.1)
Basophils Relative: 0.6 % (ref 0.0–3.0)
Eosinophils Absolute: 0.5 10*3/uL (ref 0.0–0.7)
Eosinophils Relative: 5.6 % — ABNORMAL HIGH (ref 0.0–5.0)
HCT: 36.6 % (ref 36.0–46.0)
Hemoglobin: 11.9 g/dL — ABNORMAL LOW (ref 12.0–15.0)
Lymphocytes Relative: 31.8 % (ref 12.0–46.0)
Lymphs Abs: 3 10*3/uL (ref 0.7–4.0)
MCHC: 32.6 g/dL (ref 30.0–36.0)
MCV: 86 fl (ref 78.0–100.0)
Monocytes Absolute: 0.6 10*3/uL (ref 0.1–1.0)
Monocytes Relative: 5.9 % (ref 3.0–12.0)
Neutro Abs: 5.3 10*3/uL (ref 1.4–7.7)
Neutrophils Relative %: 56.1 % (ref 43.0–77.0)
Platelets: 383 10*3/uL (ref 150.0–400.0)
RBC: 4.25 Mil/uL (ref 3.87–5.11)
RDW: 15.1 % (ref 11.5–15.5)
WBC: 9.4 10*3/uL (ref 4.0–10.5)

## 2022-08-19 LAB — URINALYSIS, MICROSCOPIC ONLY

## 2022-08-19 LAB — POC URINALSYSI DIPSTICK (AUTOMATED)
Glucose, UA: NEGATIVE
Nitrite, UA: NEGATIVE
Protein, UA: POSITIVE — AB
Spec Grav, UA: >=1.03 — AB (ref 1.010–1.025)
Urobilinogen, UA: 0.2 E.U./dL
pH, UA: 5.5 (ref 5.0–8.0)

## 2022-08-19 LAB — TSH: TSH: 1.5 u[IU]/mL (ref 0.35–5.50)

## 2022-08-19 LAB — VITAMIN D 25 HYDROXY (VIT D DEFICIENCY, FRACTURES): VITD: 18.51 ng/mL — ABNORMAL LOW (ref 30.00–100.00)

## 2022-08-19 NOTE — Patient Instructions (Addendum)
Labs today Will send urine for culture.  Pass by lab to pick up stool study for calprotectin.  Start miralax 1/2 capful daily (over the counter), hold for diarrhea.  Increase water, fiber in diet.  Let us know if ongoing symptoms for referral to GI.

## 2022-08-19 NOTE — Progress Notes (Unsigned)
Ph: [phone] Fax: [phone]   Patient ID: Diana Dean, female    DOB: [birth year], 22 y.o.   MRN: [id number]  This visit was conducted in person.  BP 112/68   Pulse 80   Temp (!) 97.4 F (36.3 C) (Temporal)   Ht 5\' 6"  (1.676 m)   Wt 202 lb 6 oz (91.8 kg)   LMP 08/05/2022   SpO2 98%   BMI 32.66 kg/m    CC: bowel changes Subjective:   HPI: Diana Dean is a 22 y.o. female presenting on 08/19/2022 for GI Problem (C/o low abd pain, R low back pain and blood in stool. Sxs started 08/16/22. Pt accompanied by Diana Dean, [name]. )   5d h/o lower abdominal pain, R low back pain, and blood noted in stool x2. Some nausea with this. Has stomach ache every few weeks associated with loose stools. She's sure blood noted was rectal in origin (not urinary or vaginal).   Normally 1 BM/day. She does have large BMs.  Rare straining.  No fevers/chills, vomiting, dysuria, urinary urgency or frequency.  No new rashes, joint pains, redness of eyes.  Normal appetite. No unexpected weight changes, no early satiety, dysphagia.   No noted hemorrhoids.  No pain with defecation.   Has not tried anything for pain yet.  She normally takes probiotic to help constipation.  Previously on miralax.   She takes OTC supplement for focus - taking this for last 1-2 yrs.   No fmhx IBD. Maternal grandmother with IBS.   LMP - 08/05/2022   H/o chronic hematuria - thought TBM disease given fmhx.      Relevant past medical, surgical, family and social history reviewed and updated as indicated. Interim medical history since our last visit reviewed. Allergies and medications reviewed and updated. Outpatient Medications Prior to Visit  Medication Sig Dispense Refill   hydrOXYzine (VISTARIL) 25 MG capsule Take 1 capsule (25 mg total) by mouth every 8 (eight) hours as needed.     Cholecalciferol (VITAMIN D3) 25 MCG (1000 UT) CAPS Take 1 capsule (1,000 Units total) by mouth daily. 30 capsule    Cyanocobalamin  (B-12) 1000 MCG SUBL Place 1 tablet under the tongue daily.     No facility-administered medications prior to visit.     Per HPI unless specifically indicated in ROS section below Review of Systems  Objective:  BP 112/68   Pulse 80   Temp (!) 97.4 F (36.3 C) (Temporal)   Ht 5\' 6"  (1.676 m)   Wt 202 lb 6 oz (91.8 kg)   LMP 08/05/2022   SpO2 98%   BMI 32.66 kg/m   Wt Readings from Last 3 Encounters:  08/19/22 202 lb 6 oz (91.8 kg)  07/03/20 185 lb 5 oz (84.1 kg) (96 %, Z= 1.70)*  03/29/16 193 lb 1.9 oz (87.6 kg) (98 %, Z= 2.05)*   * Growth percentiles are based on CDC (Girls, 2-20 Years) data.      Physical Exam Vitals and nursing note reviewed.  Constitutional:      Appearance: Normal appearance. She is not ill-appearing.  HENT:     Head: Normocephalic and atraumatic.     Mouth/Throat:     Mouth: Mucous membranes are moist.     Pharynx: Oropharynx is clear. No oropharyngeal exudate or posterior oropharyngeal erythema.  Eyes:     Extraocular Movements: Extraocular movements intact.     Pupils: Pupils are equal, round, and reactive to light.  Neck:     Thyroid:  No thyroid mass or thyromegaly.  Cardiovascular:     Rate and Rhythm: Normal rate and regular rhythm.     Pulses: Normal pulses.     Heart sounds: Normal heart sounds. No murmur heard. Pulmonary:     Effort: Pulmonary effort is normal. No respiratory distress.     Breath sounds: Normal breath sounds. No wheezing, rhonchi or rales.  Abdominal:     General: Bowel sounds are normal. There is no distension.     Palpations: Abdomen is soft. There is no mass.     Tenderness: There is abdominal tenderness (mild) in the epigastric area. There is no right CVA tenderness, left CVA tenderness, guarding or rebound. Negative signs include Murphy's sign.     Hernia: No hernia is present.  Musculoskeletal:     Cervical back: Normal range of motion and neck supple.     Right lower leg: No edema.     Left lower leg: No  edema.  Skin:    General: Skin is warm and dry.     Findings: No rash.  Neurological:     Mental Status: She is alert.  Psychiatric:        Mood and Affect: Mood normal.        Behavior: Behavior normal.       Results for orders placed or performed in visit on 08/19/22  Urine Microscopic  Result Value Ref Range   WBC, UA 11-20/hpf (A) 0-2/hpf   RBC / HPF 0-2/hpf 0-2/hpf   Mucus, UA Presence of (A) None   Squamous Epithelial / HPF Rare(0-4/hpf) Rare(0-4/hpf)   Bacteria, UA Few(10-50/hpf) (A) None   Hyaline Casts, UA Presence of (A) None   Ca Oxalate Crys, UA Presence of (A) None   Amorphous Present (A) None;Present  Vitamin B12  Result Value Ref Range   Vitamin B-12 396 211 - 911 pg/mL  VITAMIN D 25 Hydroxy (Vit-D Deficiency, Fractures)  Result Value Ref Range   VITD 18.51 (L) 30.00 - 100.00 ng/mL  Comprehensive metabolic panel  Result Value Ref Range   Sodium 140 135 - 145 mEq/L   Potassium 4.0 3.5 - 5.1 mEq/L   Chloride 104 96 - 112 mEq/L   CO2 28 19 - 32 mEq/L   Glucose, Bld 107 (H) 70 - 99 mg/dL   BUN 15 6 - 23 mg/dL   Creatinine, Ser 1.61 0.40 - 1.20 mg/dL   Total Bilirubin 0.3 0.2 - 1.2 mg/dL   Alkaline Phosphatase 76 39 - 117 U/L   AST 18 0 - 37 U/L   ALT 24 0 - 35 U/L   Total Protein 7.2 6.0 - 8.3 g/dL   Albumin 4.3 3.5 - 5.2 g/dL   GFR 096.04 >54.09 mL/min   Calcium 9.6 8.4 - 10.5 mg/dL  TSH  Result Value Ref Range   TSH 1.50 0.35 - 5.50 uIU/mL  CBC with Differential/Platelet  Result Value Ref Range   WBC 9.4 4.0 - 10.5 K/uL   RBC 4.25 3.87 - 5.11 Mil/uL   Hemoglobin 11.9 (L) 12.0 - 15.0 g/dL   HCT 81.1 91.4 - 78.2 %   MCV 86.0 78.0 - 100.0 fl   MCHC 32.6 30.0 - 36.0 g/dL   RDW 95.6 21.3 - 08.6 %   Platelets 383.0 150.0 - 400.0 K/uL   Neutrophils Relative % 56.1 43.0 - 77.0 %   Lymphocytes Relative 31.8 12.0 - 46.0 %   Monocytes Relative 5.9 3.0 - 12.0 %   Eosinophils Relative 5.6 (H)  0.0 - 5.0 %   Basophils Relative 0.6 0.0 - 3.0 %   Neutro Abs  5.3 1.4 - 7.7 K/uL   Lymphs Abs 3.0 0.7 - 4.0 K/uL   Monocytes Absolute 0.6 0.1 - 1.0 K/uL   Eosinophils Absolute 0.5 0.0 - 0.7 K/uL   Basophils Absolute 0.1 0.0 - 0.1 K/uL  POCT Urinalysis Dipstick (Automated)  Result Value Ref Range   Color, UA yellow    Clarity, UA clear    Glucose, UA Negative Negative   Bilirubin, UA 1+    Ketones, UA +/-    Spec Grav, UA >=1.030 (A) 1.010 - 1.025   Blood, UA 3+    pH, UA 5.5 5.0 - 8.0   Protein, UA Positive (A) Negative   Urobilinogen, UA 0.2 0.2 or 1.0 E.U./dL   Nitrite, UA negative    Leukocytes, UA Small (1+) (A) Negative    Assessment & Plan:   Problem List Items Addressed This Visit     Microhematuria    Chronic, thought thin basement membrane disease - in h/o same in father.  Today with persistent blood - will send culture and urine microscopy      Relevant Orders   Urine Culture   Urine Microscopic (Completed)   Constipation    Current symptoms could be exacerbation of chronic constipation.  Discussed this.  Reviewed fiber in diet and increased water intake.  Recommend start MiraLAX half to 1 capful daily for goal of 1 soft stool per day, holding for loose stools.      Vitamin D deficiency    Update levels.      Relevant Orders   VITAMIN D 25 Hydroxy (Vit-D Deficiency, Fractures) (Completed)   Vitamin B12 deficiency    Update levels      Relevant Orders   Vitamin B12 (Completed)   Lower abdominal pain - Primary    Describes several days of lower abdominal pain, right lower back pain, and blood noted both with wiping and in commode mixed in stool.  Discussed possible etiologies including internal hemorrhoid, first capillary from constipation, inflammatory bowels versus other. Will further evaluate with lab work including CBC, and fecal calprotectin.  Discussed constipation treatment, and if ongoing symptoms or abnormal workup will refer to GI.  Patient and Diana Dean agree with plan.      Relevant Orders   Comprehensive  metabolic panel (Completed)   TSH (Completed)   CBC with Differential/Platelet (Completed)   Calprotectin, Fecal   Blood in stool    See above.  She is not currently on her period. No red flags of weight loss or ongoing significant bleeding.      Relevant Orders   Comprehensive metabolic panel (Completed)   TSH (Completed)   CBC with Differential/Platelet (Completed)   Calprotectin, Fecal   Other Visit Diagnoses     Acute right-sided low back pain, unspecified whether sciatica present       Relevant Orders   POCT Urinalysis Dipstick (Automated) (Completed)        No orders of the defined types were placed in this encounter.   Orders Placed This Encounter  Procedures   Urine Culture   Calprotectin, Fecal    Standing Status:   Future    Standing Expiration Date:   08/19/2023   Urine Microscopic   Vitamin B12   VITAMIN D 25 Hydroxy (Vit-D Deficiency, Fractures)   Comprehensive metabolic panel   TSH   CBC with Differential/Platelet   POCT Urinalysis Dipstick (Automated)  Patient Instructions  Labs today Will send urine for culture.  Pass by lab to pick up stool study for calprotectin.  Start miralax 1/2 capful daily (over the counter), hold for diarrhea.  Increase water, fiber in diet.  Let us know if ongoing symptoms for referral to GI.   Follow up plan: Return if symptoms worsen or fail to improve.  Eustaquio Boyden, MD

## 2022-08-20 ENCOUNTER — Encounter: Payer: Self-pay | Admitting: Family Medicine

## 2022-08-20 DIAGNOSIS — K921 Melena: Secondary | ICD-10-CM | POA: Insufficient documentation

## 2022-08-20 DIAGNOSIS — R1030 Lower abdominal pain, unspecified: Secondary | ICD-10-CM | POA: Insufficient documentation

## 2022-08-20 NOTE — Assessment & Plan Note (Signed)
Update levels. 

## 2022-08-20 NOTE — Assessment & Plan Note (Signed)
Current symptoms could be exacerbation of chronic constipation.  Discussed this.  Reviewed fiber in diet and increased water intake.  Recommend start MiraLAX half to 1 capful daily for goal of 1 soft stool per day, holding for loose stools.

## 2022-08-20 NOTE — Assessment & Plan Note (Addendum)
See above.  She is not currently on her period. No red flags of weight loss or ongoing significant bleeding.

## 2022-08-20 NOTE — Assessment & Plan Note (Signed)
Describes several days of lower abdominal pain, right lower back pain, and blood noted both with wiping and in commode mixed in stool.  Discussed possible etiologies including internal hemorrhoid, first capillary from constipation, inflammatory bowels versus other. Will further evaluate with lab work including CBC, and fecal calprotectin.  Discussed constipation treatment, and if ongoing symptoms or abnormal workup will refer to GI.  Patient and mom agree with plan.

## 2022-08-20 NOTE — Assessment & Plan Note (Signed)
Chronic, thought thin basement membrane disease - in h/o same in father.  Today with persistent blood - will send culture and urine microscopy

## 2022-08-22 ENCOUNTER — Other Ambulatory Visit: Payer: Self-pay | Admitting: Family Medicine

## 2022-08-22 ENCOUNTER — Other Ambulatory Visit: Payer: Self-pay | Admitting: Radiology

## 2022-08-22 DIAGNOSIS — R1030 Lower abdominal pain, unspecified: Secondary | ICD-10-CM

## 2022-08-22 DIAGNOSIS — K921 Melena: Secondary | ICD-10-CM

## 2022-08-22 DIAGNOSIS — N390 Urinary tract infection, site not specified: Secondary | ICD-10-CM | POA: Insufficient documentation

## 2022-08-22 DIAGNOSIS — N3000 Acute cystitis without hematuria: Secondary | ICD-10-CM

## 2022-08-22 LAB — URINE CULTURE
MICRO NUMBER:: 15176668
SPECIMEN QUALITY:: ADEQUATE

## 2022-08-22 MED ORDER — AMOXICILLIN 875 MG PO TABS: 875.0000 mg | ORAL_TABLET | Freq: Two times a day (BID) | ORAL | 0 refills | Status: AC

## 2022-08-30 LAB — CALPROTECTIN, FECAL: Calprotectin, Fecal: 207 ug/g — ABNORMAL HIGH (ref 0–120)

## 2022-09-01 ENCOUNTER — Other Ambulatory Visit: Payer: Self-pay | Admitting: Family Medicine

## 2022-09-01 ENCOUNTER — Telehealth: Payer: Self-pay

## 2022-09-01 DIAGNOSIS — K5900 Constipation, unspecified: Secondary | ICD-10-CM

## 2022-09-01 DIAGNOSIS — R1030 Lower abdominal pain, unspecified: Secondary | ICD-10-CM

## 2022-09-01 DIAGNOSIS — R195 Other fecal abnormalities: Secondary | ICD-10-CM

## 2022-09-01 DIAGNOSIS — K921 Melena: Secondary | ICD-10-CM

## 2022-09-01 NOTE — Telephone Encounter (Signed)
Attempted to contact pt. No answer. No vm.  Need to relay lab results and Dr. Timoteo Expose message (see Labs, Result Notes- 08/22/22).  Labs/Dr. Timoteo Expose msg: Your fecal calprotectin returned elevated suggesting inflammation in the stool/colon. Offering further evaluation with GI referral given elevated reading in setting of blood in stool. Referral placed to [facility]. You will get a call to be scheduled.

## 2022-09-02 NOTE — Telephone Encounter (Addendum)
Attempted to contact pt. No answer. No vm. Also, mailed a letter.   Need to relay lab results and Dr. Timoteo Expose message (see Labs, Result Notes- 08/22/22).  Labs/Dr. Timoteo Expose msg: Your fecal calprotectin returned elevated suggesting inflammation in the stool/colon. Offering further evaluation with GI referral given elevated reading in setting of blood in stool. Referral placed to [facility]. You will get a call to be scheduled.

## 2022-09-03 NOTE — Telephone Encounter (Signed)
Attempted to contact pt. No answer. No vm. Also, mailed a letter on 09/02/22.   Need to relay lab results and Dr. Timoteo Expose message (see Labs, Result Notes- 08/22/22).  Labs/Dr. Timoteo Expose msg: Your fecal calprotectin returned elevated suggesting inflammation in the stool/colon. Offering further evaluation with GI referral given elevated reading in setting of blood in stool. Referral placed to [facility]. You will get a call to be scheduled.

## 2022-09-16 ENCOUNTER — Telehealth: Payer: Self-pay | Admitting: Family Medicine

## 2022-09-16 NOTE — Telephone Encounter (Signed)
Patient's mother contacted the office regarding GI referral sent for patient. States [facility] cannot see pt until end of September, mother wanted to know if this is an urgent issue that the patient should be seen sooner for? If so, asked that another referral be placed to an office in either [place] or [place]. Please advise pt's mother if needed, [phone] (Okay per DPR)

## 2022-09-16 NOTE — Telephone Encounter (Addendum)
Depends - how is she feeling? Any ongoing abdominal pain or blood in stool?  Has more regular miralax use helped for goal 1 soft BM daily?

## 2022-09-17 NOTE — Telephone Encounter (Addendum)
Spoke with pt's mom, [name] (on dpr), asking Dr. Timoteo Expose questions. Says pt still c/o abd pain but it's not as bad as it was. Pt did see a little blood in stool about 2 days ago but none since. Says pt is not have 1 soft BM daily, just sometimes.

## 2022-11-03 ENCOUNTER — Encounter: Payer: Self-pay | Admitting: Physician Assistant

## 2022-11-03 ENCOUNTER — Ambulatory Visit (INDEPENDENT_AMBULATORY_CARE_PROVIDER_SITE_OTHER): Payer: BC Managed Care – PPO | Admitting: Physician Assistant

## 2022-11-03 VITALS — BP 113/78 | HR 91 | Temp 98.2°F | Ht 67.0 in | Wt 202.4 lb

## 2022-11-03 DIAGNOSIS — K625 Hemorrhage of anus and rectum: Secondary | ICD-10-CM

## 2022-11-03 DIAGNOSIS — K5904 Chronic idiopathic constipation: Secondary | ICD-10-CM

## 2022-11-03 DIAGNOSIS — R1084 Generalized abdominal pain: Secondary | ICD-10-CM

## 2022-11-03 DIAGNOSIS — R1032 Left lower quadrant pain: Secondary | ICD-10-CM

## 2022-11-03 DIAGNOSIS — R195 Other fecal abnormalities: Secondary | ICD-10-CM

## 2022-11-03 MED ORDER — LINACLOTIDE 145 MCG PO CAPS
145.0000 ug | ORAL_CAPSULE | Freq: Every day | ORAL | 5 refills | Status: DC
Start: 2022-11-03 — End: 2023-03-04

## 2022-11-03 NOTE — Patient Instructions (Addendum)
CT scheduled @ 11-11-22 [facility] arrive at 10:15 am.   Please go directly to [facility] to have labs drawn.

## 2022-11-03 NOTE — Progress Notes (Signed)
Celso Amy, PA-C [address]  [address]  [place], [place] [address]  Main: [phone]  Fax: [phone]   Gastroenterology Consultation  Referring Provider:     Eustaquio Boyden, MD Primary Care Physician:  Eustaquio Boyden, MD Primary Gastroenterologist:  Celso Amy, PA-C / Dr. Wyline Mood   Reason for Consultation:     Blood in stool, elevated fecal calprotectin, lower abdominal pain        HPI:   Diana Dean is a 22 y.o. y/o female referred for consultation & management  by Eustaquio Boyden, MD.    Patient has chronic constipation since childhood.  Treated with fiber and MiraLAX.  She does not take MiraLAX every day.  Only takes it sporadically.  She has not tried any other treatment for constipation.  She has noticed lower abdominal pain with bright red blood in her stool with wiping and in the toilet.  Blood mixed in the stool.  Abdominal pain is worse in the left lower quadrant.  She has noticed generalized abdominal pain intermittently throughout her entire abdomen.  Denies unintentional weight loss.  Was treated for UTI with amoxicillin 08/2022.  Labs 08/2022 showed Mild anemia with a hemoglobin 11.9.  Previous baseline hemoglobin 12.3 in 2022.  Normal TSH and CMP.  Stool test showed moderately elevated fecal calprotectin 207, concerning for IBD.  No recent abdominal imaging.  No previous colonoscopy or GI evaluation.  No family history of colon cancer or IBD.  Past Medical History:  Diagnosis Date   Croup 01/2002   History of behavioral and mental health problems 06/2020   behavioral health hospitalization at [facility] 7d for HI, auditory hallucinations started on zyprexa/hydroxyzine   History of recurrent UTIs    Microhematuria 11/22/2015   Reviewed urology notes - normal renal US and KUB, deemed benign persistent hematuria (2011) Suspect benign TBM (thin) disease given fmhx (father)    History reviewed. No pertinent surgical history.  Prior to  Admission medications   Medication Sig Start Date End Date Taking? Authorizing Provider  Cholecalciferol (VITAMIN D3) 25 MCG (1000 UT) CAPS Take 1 capsule (1,000 Units total) by mouth daily. 07/21/20   Eustaquio Boyden, MD  Cyanocobalamin (B-12) 1000 MCG SUBL Place 1 tablet under the tongue daily. 07/21/20   Eustaquio Boyden, MD  hydrOXYzine (VISTARIL) 25 MG capsule Take 1 capsule (25 mg total) by mouth every 8 (eight) hours as needed. 07/21/20   Eustaquio Boyden, MD    Family History  Problem Relation Age of Onset   Cancer Maternal Grandmother        brain tumor   Hematuria Father        microhematuria     Social History   Tobacco Use   Smoking status: Never   Smokeless tobacco: Never  Substance Use Topics   Alcohol use: No   Drug use: No    Allergies as of 11/03/2022 - Review Complete 11/03/2022  Allergen Reaction Noted   Codeine Nausea Only 03/29/2016   Omnicef [cefdinir] Diarrhea 11/22/2015    Review of Systems:    All systems reviewed and negative except where noted in HPI.   Physical Exam:  BP 113/78   Pulse 91   Temp 98.2 F (36.8 C)   Ht 5\' 7"  (1.702 m)   Wt 202 lb 6.4 oz (91.8 kg)   LMP 09/30/2022   BMI 31.70 kg/m  Patient's last menstrual period was 09/30/2022.  General:   Alert,  Well-developed, well-nourished, pleasant and cooperative in  NAD Lungs:  Respirations even and unlabored.  Clear throughout to auscultation.   No wheezes, crackles, or rhonchi. No acute distress. Heart:  Regular rate and rhythm; no murmurs, clicks, rubs, or gallops. Abdomen:  Normal bowel sounds.  No bruits.  Soft, and non-distended without masses, hepatosplenomegaly or hernias noted.  Mild to moderate LLQ and Left Sided abdominal Tenderness.  No Right side or upper abdominal tenderness.  No guarding or rebound tenderness.    Neurologic:  Alert and oriented x3;  grossly normal neurologically. Psych:  Alert and cooperative. Normal mood and affect.  Imaging Studies: No results  found.  Assessment and Plan:   Diana Dean is a 22 y.o. y/o female has been referred for lower abdominal pain, rectal bleeding, constipation, and moderately elevated fecal calprotectin.  Symptoms are concerning for inflammatory bowel disease.  Scheduling colonoscopy for further evaluation.    1.  Rectal bleeding and elevated fecal calprotectin  Scheduling Colonoscopy I discussed risks of colonoscopy with patient to include risk of bleeding, colon perforation, and risk of sedation.  Patient expressed understanding and agrees to proceed with colonoscopy.   Lab CBC, CRP, CMP, HCG  2.  Chronic idiopathic constipation (lifelong)  Start Linzess daily -samples and prescription given.  High-fiber diet.  Drink 64 ounces of fluids daily.  3.  Generalized Abdominal Pain; Worse in the LLQ  CT Abd / Pelvis with contrast.   Follow up 4 weeks after Colonoscopy with TG.  Celso Amy, PA-C

## 2022-11-04 LAB — CBC WITH DIFFERENTIAL/PLATELET
Basophils Absolute: 0.1 10*3/uL (ref 0.0–0.2)
Basos: 1 %
EOS (ABSOLUTE): 0.4 10*3/uL (ref 0.0–0.4)
Eos: 4 %
Hematocrit: 35.9 % (ref 34.0–46.6)
Hemoglobin: 11.7 g/dL (ref 11.1–15.9)
Immature Grans (Abs): 0 10*3/uL (ref 0.0–0.1)
Immature Granulocytes: 0 %
Lymphocytes Absolute: 2.4 10*3/uL (ref 0.7–3.1)
Lymphs: 23 %
MCH: 28.3 pg (ref 26.6–33.0)
MCHC: 32.6 g/dL (ref 31.5–35.7)
MCV: 87 fL (ref 79–97)
Monocytes Absolute: 0.9 10*3/uL (ref 0.1–0.9)
Monocytes: 8 %
Neutrophils Absolute: 6.6 10*3/uL (ref 1.4–7.0)
Neutrophils: 64 %
Platelets: 365 10*3/uL (ref 150–450)
RBC: 4.14 x10E6/uL (ref 3.77–5.28)
RDW: 13.3 % (ref 11.7–15.4)
WBC: 10.3 10*3/uL (ref 3.4–10.8)

## 2022-11-04 LAB — C-REACTIVE PROTEIN: CRP: 5 mg/L (ref 0–10)

## 2022-11-04 LAB — COMPREHENSIVE METABOLIC PANEL
ALT: 30 IU/L (ref 0–32)
AST: 26 IU/L (ref 0–40)
Albumin: 4.2 g/dL (ref 4.0–5.0)
Alkaline Phosphatase: 76 IU/L (ref 44–121)
BUN/Creatinine Ratio: 22 (ref 9–23)
BUN: 13 mg/dL (ref 6–20)
Bilirubin Total: 0.2 mg/dL (ref 0.0–1.2)
CO2: 22 mmol/L (ref 20–29)
Calcium: 9.3 mg/dL (ref 8.7–10.2)
Chloride: 103 mmol/L (ref 96–106)
Creatinine, Ser: 0.58 mg/dL (ref 0.57–1.00)
Globulin, Total: 2.8 g/dL (ref 1.5–4.5)
Glucose: 92 mg/dL (ref 70–99)
Potassium: 4.6 mmol/L (ref 3.5–5.2)
Sodium: 140 mmol/L (ref 134–144)
Total Protein: 7 g/dL (ref 6.0–8.5)
eGFR: 132 mL/min/{1.73_m2} (ref >59)

## 2022-11-04 LAB — HCG, SERUM, QUALITATIVE: hCG,Beta Subunit,Qual,Serum: NEGATIVE m[IU]/mL (ref <6)

## 2022-11-05 ENCOUNTER — Telehealth: Payer: Self-pay

## 2022-11-05 NOTE — Progress Notes (Signed)
Notify patient all her labs are normal.  Pregnancy test is negative.  White count, hemoglobin, kidney test, and liver test are normal.  CRP, which is a marker of inflammation, is normal.  Continue with current plan for abdominal pelvic CT and colonoscopy as scheduled.  Continue Linzess for constipation. Celso Amy, PA-C

## 2022-11-05 NOTE — Telephone Encounter (Signed)
Tried reaching patient at this time-unable to leave message.   Notify patient all her labs are normal.  Pregnancy test is negative.  White count, hemoglobin, kidney test, and liver test are normal.  CRP, which is a marker of inflammation, is normal.  Continue with current plan for abdominal pelvic CT and colonoscopy as scheduled.  Continue Linzess for constipation.  Celso Amy, PA-C

## 2022-11-06 ENCOUNTER — Telehealth: Payer: Self-pay

## 2022-11-06 NOTE — Telephone Encounter (Signed)
Tried reaching patient again regarding results-no answer.

## 2022-11-11 ENCOUNTER — Ambulatory Visit
Admission: RE | Admit: 2022-11-11 | Discharge: 2022-11-11 | Disposition: A | Discharge status: Home / Self Care | Payer: BC Managed Care – PPO | Source: Ambulatory Visit | Attending: Physician Assistant | Admitting: Physician Assistant

## 2022-11-11 DIAGNOSIS — R1084 Generalized abdominal pain: Secondary | ICD-10-CM | POA: Insufficient documentation

## 2022-11-11 DIAGNOSIS — R935 Abnormal findings on diagnostic imaging of other abdominal regions, including retroperitoneum: Secondary | ICD-10-CM | POA: Diagnosis not present

## 2022-11-11 DIAGNOSIS — N83291 Other ovarian cyst, right side: Secondary | ICD-10-CM | POA: Diagnosis not present

## 2022-11-11 DIAGNOSIS — R109 Unspecified abdominal pain: Secondary | ICD-10-CM | POA: Diagnosis not present

## 2022-11-11 MED ORDER — IOHEXOL 300 MG/ML  SOLN
100.0000 mL | Freq: Once | INTRAMUSCULAR | Status: AC | PRN
  Administered 2022-11-11: 100 mL via INTRAVENOUS

## 2022-11-12 ENCOUNTER — Telehealth: Payer: Self-pay

## 2022-11-12 NOTE — Telephone Encounter (Signed)
Spoke with [name] patients mother-Call and notify patient abdominal pelvic CT shows no acute abnormality.  Incidental 4.6 cm right ovarian cyst.  No evidence of inflammatory bowel disease.  Follow-up with GYN for ovarian cyst.  Otherwise continue with current GI plan.

## 2022-11-12 NOTE — Progress Notes (Signed)
Call and notify patient abdominal pelvic CT shows no acute abnormality.  Incidental 4.6 cm right ovarian cyst.  No evidence of inflammatory bowel disease.  Follow-up with GYN for ovarian cyst.  Otherwise continue with current GI plan.

## 2022-12-05 ENCOUNTER — Encounter: Payer: Self-pay | Admitting: Gastroenterology

## 2022-12-08 ENCOUNTER — Telehealth: Payer: Self-pay | Admitting: Physician Assistant

## 2022-12-08 ENCOUNTER — Telehealth: Payer: Self-pay

## 2022-12-08 MED ORDER — PEG 3350-KCL-NA BICARB-NACL 420 G PO SOLR: 4000.0000 mL | Freq: Once | ORAL | 0 refills | Status: AC

## 2022-12-08 NOTE — Telephone Encounter (Signed)
Patient called office at this time needing Golytely called to pharmacy.

## 2022-12-08 NOTE — Telephone Encounter (Signed)
Patient mother called about requesting the prep. The prep should be sent to [facility] on [address] [place], [place] [address].

## 2022-12-10 ENCOUNTER — Telehealth: Payer: Self-pay | Admitting: Physician Assistant

## 2022-12-10 ENCOUNTER — Telehealth: Payer: Self-pay

## 2022-12-10 NOTE — Telephone Encounter (Signed)
Spoke with patient;s mother and patient is sick and we rescheduled Colonoscopy 01/20/23 with Dr.Anna -new instructions were mailed today.  Spoke with [facility] endoscopy unit.

## 2022-12-10 NOTE — Telephone Encounter (Signed)
Patient mother [name]) called in the patient has a small cold. She wants to reschedule her procedure.

## 2023-01-13 ENCOUNTER — Ambulatory Visit: Payer: BC Managed Care – PPO | Admitting: Physician Assistant

## 2023-01-20 ENCOUNTER — Encounter: Payer: Self-pay | Admitting: Gastroenterology

## 2023-01-20 ENCOUNTER — Ambulatory Visit
Admission: RE | Admit: 2023-01-20 | Discharge: 2023-01-20 | Disposition: A | Discharge status: Home / Self Care | Payer: BC Managed Care – PPO | Attending: Gastroenterology | Admitting: Gastroenterology

## 2023-01-20 ENCOUNTER — Encounter
Admission: RE | Disposition: A | Discharge status: Home / Self Care | Payer: Self-pay | Source: Home / Self Care | Attending: Gastroenterology

## 2023-01-20 ENCOUNTER — Ambulatory Visit: Payer: BC Managed Care – PPO | Admitting: Certified Registered"

## 2023-01-20 DIAGNOSIS — K640 First degree hemorrhoids: Secondary | ICD-10-CM | POA: Diagnosis not present

## 2023-01-20 DIAGNOSIS — K625 Hemorrhage of anus and rectum: Secondary | ICD-10-CM

## 2023-01-20 HISTORY — PX: COLONOSCOPY WITH PROPOFOL: SHX5780

## 2023-01-20 HISTORY — PX: BIOPSY: SHX5522

## 2023-01-20 LAB — POCT PREGNANCY, URINE: Preg Test, Ur: NEGATIVE

## 2023-01-20 SURGERY — COLONOSCOPY WITH PROPOFOL
Anesthesia: General

## 2023-01-20 MED ORDER — PROPOFOL 500 MG/50ML IV EMUL
INTRAVENOUS | Status: DC | PRN
  Administered 2023-01-20: 120 ug/kg/min via INTRAVENOUS

## 2023-01-20 MED ORDER — MIDAZOLAM HCL 2 MG/2ML IJ SOLN
INTRAMUSCULAR | Status: AC
  Filled 2023-01-20: qty 2

## 2023-01-20 MED ORDER — PROPOFOL 10 MG/ML IV BOLUS
INTRAVENOUS | Status: DC | PRN
  Administered 2023-01-20: 100 mg via INTRAVENOUS

## 2023-01-20 MED ORDER — MIDAZOLAM HCL 5 MG/5ML IJ SOLN
INTRAMUSCULAR | Status: DC | PRN
  Administered 2023-01-20: 2 mg via INTRAVENOUS

## 2023-01-20 MED ORDER — LIDOCAINE 2% (20 MG/ML) 5 ML SYRINGE
INTRAMUSCULAR | Status: DC | PRN
  Administered 2023-01-20: 20 mg via INTRAVENOUS

## 2023-01-20 MED ORDER — SODIUM CHLORIDE 0.9 % IV SOLN: INTRAVENOUS | Status: DC

## 2023-01-20 NOTE — Op Note (Signed)
[facility] Gastroenterology Patient Name: Diana Dean Procedure Date: 01/20/2023 10:46 AM MRN: [id number] Account #: [id number] Date of Birth: [birth year] Admit Type: Outpatient Age: 22 Room: [facility] ENDO ROOM 2 Gender: Female Note Status: Finalized Instrument Name: Nelda Marseille [id number] Procedure:             Colonoscopy Indications:           Rectal bleeding Providers:             Wyline Mood MD, MD Referring MD:          Wyline Mood MD, MD (Referring MD), Eustaquio Boyden                         (Referring MD) Medicines:             Monitored Anesthesia Care Complications:         No immediate complications. Procedure:             Pre-Anesthesia Assessment:                        - Prior to the procedure, a History and Physical was                         performed, and patient medications, allergies and                         sensitivities were reviewed. The patient's tolerance                         of previous anesthesia was reviewed.                        - The risks and benefits of the procedure and the                         sedation options and risks were discussed with the                         patient. All questions were answered and informed                         consent was obtained.                        - ASA Grade Assessment: II - A patient with mild                         systemic disease.                        After obtaining informed consent, the colonoscope was                         passed under direct vision. Throughout the procedure,                         the patient's blood pressure, pulse, and oxygen                         saturations were monitored continuously. The  Colonoscope was introduced through the anus and                         advanced to the the cecum, identified by the                         appendiceal orifice. The colonoscopy was performed                         with ease. The patient  tolerated the procedure well.                         The quality of the bowel preparation was excellent.                         The ileocecal valve, appendiceal orifice, and rectum                         were photographed. Findings:      The perianal and digital rectal examinations were normal.      Normal mucosa was found in the entire colon. Biopsies were taken with a       cold forceps for histology.      Non-bleeding internal hemorrhoids were found during retroflexion. The       hemorrhoids were small and Grade I (internal hemorrhoids that do not       prolapse).      The exam was otherwise without abnormality on direct and retroflexion       views. Impression:            - Normal mucosa in the entire examined colon. Biopsied.                        - Non-bleeding internal hemorrhoids.                        - The examination was otherwise normal on direct and                         retroflexion views. Recommendation:        - Discharge patient to home (with escort).                        - Resume previous diet.                        - Continue present medications.                        - Await pathology results.                        - Return to GI office as previously scheduled. Procedure Code(s):     --- Professional ---                        [phone], Colonoscopy, flexible; with biopsy, single or                         multiple Diagnosis Code(s):     --- Professional ---  K64.0, First degree hemorrhoids                        K62.5, Hemorrhage of anus and rectum CPT copyright 2022 American Medical Association. All rights reserved. The codes documented in this report are preliminary and upon coder review may  be revised to meet current compliance requirements. Wyline Mood, MD Wyline Mood MD, MD 01/20/2023 11:07:13 AM This report has been signed electronically. Number of Addenda: 0 Note Initiated On: 01/20/2023 10:46 AM Scope Withdrawal Time: 0  hours 10 minutes 2 seconds  Total Procedure Duration: 0 hours 13 minutes 14 seconds  Estimated Blood Loss:  Estimated blood loss: none.      [facility]

## 2023-01-20 NOTE — Anesthesia Postprocedure Evaluation (Signed)
Anesthesia Post Note  Patient: Diana Dean  Procedure(s) Performed: COLONOSCOPY WITH PROPOFOL BIOPSY  Patient location during evaluation: Endoscopy Anesthesia Type: General Level of consciousness: awake and alert Pain management: pain level controlled Vital Signs Assessment: post-procedure vital signs reviewed and stable Respiratory status: spontaneous breathing, nonlabored ventilation, respiratory function stable and patient connected to nasal cannula oxygen Cardiovascular status: blood pressure returned to baseline and stable Postop Assessment: no apparent nausea or vomiting Anesthetic complications: no   No notable events documented.   Last Vitals:  Vitals:   01/20/23 1111 01/20/23 1121  BP: (!) 107/56 105/65  Pulse: 81   Resp: 16   Temp:    SpO2: 99% 100%    Last Pain:  Vitals:   01/20/23 1110  TempSrc: Temporal  PainSc:                  Cleda Mccreedy Egan Sahlin

## 2023-01-20 NOTE — Transfer of Care (Signed)
Immediate Anesthesia Transfer of Care Note  Patient: Diana Dean  Procedure(s) Performed: COLONOSCOPY WITH PROPOFOL BIOPSY  Patient Location: Endoscopy Unit  Anesthesia Type:General  Level of Consciousness: awake and drowsy  Airway & Oxygen Therapy: Patient Spontanous Breathing  Post-op Assessment: Report given to RN and Post -op Vital signs reviewed and stable  Post vital signs: Reviewed  Last Vitals:  Vitals Value Taken Time  BP 107/56 01/20/23 1110  Temp    Pulse 81 01/20/23 1110  Resp 16 01/20/23 1110  SpO2 99 % 01/20/23 1110  Vitals shown include unfiled device data.  Last Pain:  Vitals:   01/20/23 1019  TempSrc: Temporal  PainSc: 0-No pain         Complications: No notable events documented.

## 2023-01-20 NOTE — Anesthesia Preprocedure Evaluation (Signed)
Anesthesia Evaluation  Patient identified by MRN, date of birth, ID band Patient awake    Reviewed: Allergy & Precautions, NPO status , Patient's Chart, lab work & pertinent test results  History of Anesthesia Complications Negative for: history of anesthetic complications  Airway Mallampati: III  TM Distance: >3 FB Neck ROM: full    Dental  (+) Chipped, Implants   Pulmonary neg pulmonary ROS, neg shortness of breath   Pulmonary exam normal        Cardiovascular negative cardio ROS Normal cardiovascular exam     Neuro/Psych negative neurological ROS  negative psych ROS   GI/Hepatic negative GI ROS, Neg liver ROS,,,  Endo/Other  negative endocrine ROS    Renal/GU negative Renal ROS  negative genitourinary   Musculoskeletal   Abdominal   Peds  Hematology negative hematology ROS (+)   Anesthesia Other Findings Past Medical History: 01/2002: Croup 06/2020: History of behavioral and mental health problems     Comment:  behavioral health hospitalization at [facility] IVC 7d               for HI, auditory hallucinations started on               zyprexa/hydroxyzine No date: History of recurrent UTIs 11/22/2015: Microhematuria     Comment:  Reviewed urology notes - normal renal US and KUB, deemed              benign persistent hematuria (2011) Suspect benign TBM               (thin) disease given fmhx (father)  Past Surgical History: No date: WISDOM TOOTH EXTRACTION  BMI    Body Mass Index: 31.70 kg/m      Reproductive/Obstetrics negative OB ROS                             Anesthesia Physical Anesthesia Plan  ASA: 2  Anesthesia Plan: General   Post-op Pain Management:    Induction: Intravenous  PONV Risk Score and Plan: Propofol infusion and TIVA  Airway Management Planned: Natural Airway and Nasal Cannula  Additional Equipment:   Intra-op Plan:   Post-operative Plan:    Informed Consent: I have reviewed the patients History and Physical, chart, labs and discussed the procedure including the risks, benefits and alternatives for the proposed anesthesia with the patient or authorized representative who has indicated his/her understanding and acceptance.     Dental Advisory Given  Plan Discussed with: Anesthesiologist, CRNA and Surgeon  Anesthesia Plan Comments: (Patient consented for risks of anesthesia including but not limited to:  - adverse reactions to medications - risk of airway placement if required - damage to eyes, teeth, lips or other oral mucosa - nerve damage due to positioning  - sore throat or hoarseness - Damage to heart, brain, nerves, lungs, other parts of body or loss of life  Patient voiced understanding and assent.)       Anesthesia Quick Evaluation

## 2023-01-20 NOTE — H&P (Signed)
Wyline Mood, MD [address], [address], [place], [place], [address] [address], [address], [place], [place], [address] Phone: [phone]  Fax: [phone]  Primary Care Physician:  Eustaquio Boyden, MD   Pre-Procedure History & Physical: HPI:  Diana Dean is a 22 y.o. female is here for an colonoscopy.   Past Medical History:  Diagnosis Date   Croup 01/2002   History of behavioral and mental health problems 06/2020   behavioral health hospitalization at [facility] IVC 7d for HI, auditory hallucinations started on zyprexa/hydroxyzine   History of recurrent UTIs    Microhematuria 11/22/2015   Reviewed urology notes - normal renal US and KUB, deemed benign persistent hematuria (2011) Suspect benign TBM (thin) disease given fmhx (father)    History reviewed. No pertinent surgical history.  Prior to Admission medications   Medication Sig Start Date End Date Taking? Authorizing Provider  Cholecalciferol (VITAMIN D3) 25 MCG (1000 UT) CAPS Take 1 capsule (1,000 Units total) by mouth daily. 07/21/20   Eustaquio Boyden, MD  Cyanocobalamin (B-12) 1000 MCG SUBL Place 1 tablet under the tongue daily. 07/21/20   Eustaquio Boyden, MD  linaclotide [facility]) 145 MCG CAPS capsule Take 1 capsule (145 mcg total) by mouth daily before breakfast. 11/03/22 05/02/23  Celso Amy, PA-C    Allergies as of 11/03/2022 - Review Complete 11/03/2022  Allergen Reaction Noted   Codeine Nausea Only 03/29/2016   Omnicef [cefdinir] Diarrhea 11/22/2015    Family History  Problem Relation Age of Onset   Cancer Maternal Grandmother        brain tumor   Hematuria Father        microhematuria    Social History   Socioeconomic History   Marital status: Single    Spouse name: Not on file   Number of children: Not on file   Years of education: Not on file   Highest education level: Not on file  Occupational History   Not on file  Tobacco Use   Smoking status: Never   Smokeless tobacco:  Never  Vaping Use   Vaping status: Never Used  Substance and Sexual Activity   Alcohol use: No   Drug use: No   Sexual activity: Not on file  Other Topics Concern   Not on file  Social History Narrative   Home schooled   Lives with parents Chrissie Noa and Plummer)   Social Determinants of Health   Financial Resource Strain: Not on file  Food Insecurity: Not on file  Transportation Needs: Not on file  Physical Activity: Not on file  Stress: Not on file  Social Connections: Not on file  Intimate Partner Violence: Not on file    Review of Systems: See HPI, otherwise negative ROS  Physical Exam: There were no vitals taken for this visit. General:   Alert,  pleasant and cooperative in NAD Head:  Normocephalic and atraumatic. Neck:  Supple; no masses or thyromegaly. Lungs:  Clear throughout to auscultation, normal respiratory effort.    Heart:  +S1, +S2, Regular rate and rhythm, No edema. Abdomen:  Soft, nontender and nondistended. Normal bowel sounds, without guarding, and without rebound.   Neurologic:  Alert and  oriented x4;  grossly normal neurologically.  Impression/Plan: Diana Dean is here for an colonoscopy to be performed for rectal bleeding . Risks, benefits, limitations, and alternatives regarding  colonoscopy have been reviewed with the patient.  Questions have been answered.  All parties agreeable.   Wyline Mood, MD  01/20/2023, 10:08  AM

## 2023-01-21 ENCOUNTER — Encounter: Payer: Self-pay | Admitting: Gastroenterology

## 2023-01-21 LAB — SURGICAL PATHOLOGY

## 2023-01-22 ENCOUNTER — Encounter: Payer: Self-pay | Admitting: Family Medicine

## 2023-01-30 ENCOUNTER — Encounter: Payer: Self-pay | Admitting: Family Medicine

## 2023-02-09 NOTE — Progress Notes (Signed)
 Ellouise Console, PA-C [address]  [address]  [place], [place] [address]  Main: [phone]  Fax: [phone]   Primary Care Physician: Rilla Baller, MD  Primary Gastroenterologist:  Ellouise Console, PA-C / Dr. Ruel Kung    CC: Follow-up rectal bleeding, chronic constipation, internal hemorrhoids  HPI: Diana Dean is a 22 y.o. female returns for 36-month follow-up of rectal bleeding, chronic constipation, and small internal hemorrhoids.  He is here today with her mom.  3 months ago she was started on Linzess  145 mcg daily.  Patient's mom was afraid of side effects from Linzess , therefore patient started taking MiraLAX again.  Currently she is taking MiraLAX 1 capful daily which has helped her constipation.  She has not had any more rectal bleeding, hard stools, straining.  GI symptoms have currently improved.  No new concerns today.  01/20/2023 colonoscopy by Dr. Kung: Excellent prep.  No polyps.  Grade 1 nonbleeding internal hemorrhoids.  No evidence of IBD.  10/2022 labs: Normal CBC, CMP, and CRP.  11/12/2022: CT abdomen pelvis with contrast: No acute abnormality.  4.6 cm right ovarian cyst.  Current Outpatient Medications  Medication Sig Dispense Refill   Cholecalciferol (VITAMIN D3) 25 MCG (1000 UT) CAPS Take 1 capsule (1,000 Units total) by mouth daily. 30 capsule    Cyanocobalamin  (B-12) 1000 MCG SUBL Place 1 tablet under the tongue daily.     linaclotide  (LINZESS ) 145 MCG CAPS capsule Take 1 capsule (145 mcg total) by mouth daily before breakfast. 30 capsule 5   No current facility-administered medications for this visit.    Allergies as of 02/10/2023 - Review Complete 02/10/2023  Allergen Reaction Noted   Codeine Nausea Only 03/29/2016   Omnicef [cefdinir] Diarrhea 11/22/2015    Past Medical History:  Diagnosis Date   Croup 01/2002   History of behavioral and mental health problems 06/2020   behavioral health hospitalization at [facility] IVC 7d for HI,  auditory hallucinations started on zyprexa/hydroxyzine   History of recurrent UTIs    Microhematuria 11/22/2015   Reviewed urology notes - normal renal US  and KUB, deemed benign persistent hematuria (2011) Suspect benign TBM (thin) disease given fmhx (father)    Past Surgical History:  Procedure Laterality Date   BIOPSY  01/20/2023   Procedure: BIOPSY;  Surgeon: Kung Ruel, MD;  Location: [facility] ENDOSCOPY;  Service: Gastroenterology;;   COLONOSCOPY WITH PROPOFOL  N/A 01/20/2023   WNL with normal biopsies Romero Ruel, MD);  [facility] ENDOSCOPY   WISDOM TOOTH EXTRACTION      Review of Systems:    All systems reviewed and negative except where noted in HPI.   Physical Examination:   BP 115/72   Pulse 88   Temp 97.7 F (36.5 C)   Ht 5' 6 (1.676 m)   Wt 210 lb 12.8 oz (95.6 kg)   BMI 34.02 kg/m   General: Well-nourished, well-developed in no acute distress.  Neuro: Alert and oriented x 3.  Grossly intact.  Psych: Alert and cooperative, normal mood and affect.   Imaging Studies: No results found.  Assessment and Plan:   Diana Dean is a 22 y.o. y/o female returns for f/u of:  1.  Chronic constipation; Improved on Miralax.  Continue Miralax 1 capful daily.  Recommend High Fiber diet with fruits, vegetables, and whole grains. Drink 64 ounces of Fluids Daily.  2.  Small internal hemorrhoids; currently assymptomatic.  Treatment discussed  Reassurance regarding normal colonoscopy.  3.  Right ovarian cyst  I recommended she  follow-up with a gynecologist.  Ellouise Console, PA-C  Follow up as Needed if recurrent GI symptoms.

## 2023-02-10 ENCOUNTER — Ambulatory Visit (INDEPENDENT_AMBULATORY_CARE_PROVIDER_SITE_OTHER): Payer: BC Managed Care – PPO | Admitting: Physician Assistant

## 2023-02-10 ENCOUNTER — Encounter: Payer: Self-pay | Admitting: Physician Assistant

## 2023-02-10 VITALS — BP 115/72 | HR 88 | Temp 97.7°F | Ht 66.0 in | Wt 210.8 lb

## 2023-02-10 DIAGNOSIS — K5909 Other constipation: Secondary | ICD-10-CM | POA: Diagnosis not present

## 2023-02-10 DIAGNOSIS — K648 Other hemorrhoids: Secondary | ICD-10-CM

## 2023-02-10 DIAGNOSIS — N83201 Unspecified ovarian cyst, right side: Secondary | ICD-10-CM | POA: Diagnosis not present

## 2023-02-10 DIAGNOSIS — K5904 Chronic idiopathic constipation: Secondary | ICD-10-CM

## 2023-02-10 DIAGNOSIS — K640 First degree hemorrhoids: Secondary | ICD-10-CM

## 2023-02-20 ENCOUNTER — Telehealth: Payer: Self-pay | Admitting: Family Medicine

## 2023-02-20 NOTE — Telephone Encounter (Signed)
 Type of forms received: assessment report   Routed to: [employer] received by : ival   Individual made aware of 3-5 business day turn around (Y/N): Y   Form completed and patient made aware of charges(Y/N): Y    Faxed to : pt requested cb once ppw is ready for pick up   Form location: Gutierrez's folder

## 2023-02-20 NOTE — Telephone Encounter (Signed)
 Placed form in Dr. Timoteo Expose box.

## 2023-02-25 NOTE — Telephone Encounter (Addendum)
 Recommend schedule CPE to complete form. May place in any open 30 min slot.

## 2023-02-26 NOTE — Telephone Encounter (Signed)
Patient has been scheduled

## 2023-02-26 NOTE — Telephone Encounter (Signed)
Noted  

## 2023-03-04 ENCOUNTER — Encounter: Payer: Self-pay | Admitting: Family Medicine

## 2023-03-04 ENCOUNTER — Ambulatory Visit (INDEPENDENT_AMBULATORY_CARE_PROVIDER_SITE_OTHER): Payer: BC Managed Care – PPO | Admitting: Family Medicine

## 2023-03-04 VITALS — BP 122/80 | HR 81 | Temp 98.4°F | Ht 66.5 in | Wt 209.4 lb

## 2023-03-04 DIAGNOSIS — Z0000 Encounter for general adult medical examination without abnormal findings: Secondary | ICD-10-CM | POA: Diagnosis not present

## 2023-03-04 DIAGNOSIS — Z13220 Encounter for screening for lipoid disorders: Secondary | ICD-10-CM

## 2023-03-04 DIAGNOSIS — Z111 Encounter for screening for respiratory tuberculosis: Secondary | ICD-10-CM | POA: Diagnosis not present

## 2023-03-04 DIAGNOSIS — E559 Vitamin D deficiency, unspecified: Secondary | ICD-10-CM | POA: Diagnosis not present

## 2023-03-04 DIAGNOSIS — E538 Deficiency of other specified B group vitamins: Secondary | ICD-10-CM

## 2023-03-04 DIAGNOSIS — Z136 Encounter for screening for cardiovascular disorders: Secondary | ICD-10-CM

## 2023-03-04 DIAGNOSIS — E66811 Obesity, class 1: Secondary | ICD-10-CM

## 2023-03-04 DIAGNOSIS — F418 Other specified anxiety disorders: Secondary | ICD-10-CM

## 2023-03-04 DIAGNOSIS — Z0279 Encounter for issue of other medical certificate: Secondary | ICD-10-CM | POA: Insufficient documentation

## 2023-03-04 LAB — TSH: TSH: 2.04 u[IU]/mL (ref 0.35–5.50)

## 2023-03-04 LAB — LIPID PANEL
Cholesterol: 109 mg/dL (ref 0–200)
HDL: 42.6 mg/dL (ref >39.00)
LDL Cholesterol: 49 mg/dL (ref 0–99)
NonHDL: 66.36
Total CHOL/HDL Ratio: 3
Triglycerides: 89 mg/dL (ref 0.0–149.0)
VLDL: 17.8 mg/dL (ref 0.0–40.0)

## 2023-03-04 LAB — VITAMIN B12: Vitamin B-12: 959 pg/mL — ABNORMAL HIGH (ref 211–911)

## 2023-03-04 LAB — VITAMIN D 25 HYDROXY (VIT D DEFICIENCY, FRACTURES): VITD: 49.97 ng/mL (ref 30.00–100.00)

## 2023-03-04 MED ORDER — N-ACETYL CYSTEINE 600 MG PO CAPS: 2.0000 | ORAL_CAPSULE | Freq: Every evening | ORAL | Status: AC

## 2023-03-04 NOTE — Assessment & Plan Note (Signed)
Preventative protocols reviewed and updated unless pt declined. Discussed healthy diet and lifestyle.  

## 2023-03-04 NOTE — Patient Instructions (Signed)
Labs today  Form filled out  Good luck with new job!

## 2023-03-04 NOTE — Assessment & Plan Note (Signed)
Update levels on 1000 mcg daily. ?

## 2023-03-04 NOTE — Progress Notes (Addendum)
Ph: [phone] Fax: [phone]   Patient ID: Diana Dean, female    DOB: 02/02/2001, 23 y.o.   MRN: [id number]  This visit was conducted in person.  BP 122/80   Pulse 81   Temp 98.4 F (36.9 C) (Oral)   Ht 5' 6.5" (1.689 m)   Wt 209 lb 6 oz (95 kg)   LMP 01/21/2023   SpO2 98%   BMI 33.29 kg/m    CC: CPE Subjective:   HPI: Diana Dean is a 23 y.o. female presenting on 03/04/2023 for Annual Exam (Needs health assessment form completed. Pt accompanied by mom, [name]. 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   Mood wise doing well - denies significant depressed mood, anxiety, SI/HI. Not on any mood medication, takes NAC capsules nightly and doing well. Saw psychiatry x1 2022 for anxiety with obsessional thoughts, has not returned.   Recent colonoscopy for BRBPR reassuringly normal - did have non-bleeding int hemorrhoids. 01/2023 by Dr Tobi Bastos.   Preventative: Well woman exam - has not had yet.  LMP - 01/21/2024 - normally monthly so a bit late this year Virginal  Flu shot - declined COVID shot - not done Tdap 06/2015 Pneumonia shot - not due Shingrix - not due Seat belt use discussed Sunscreen use discussed. No changing moles on skin. Non smoker  Alcohol  - none Dentist - q6 mo Eye exam - yearly Bowel - no constipation  Lives with parents Chrissie Noa and [name] Edu: completed HS (homeschool)  Occ: to start at daycare  Activity: no regular exercise Diet: good water, fruits/vegetables daily      Relevant past medical, surgical, family and social history reviewed and updated as indicated. Interim medical history since our last visit reviewed. Allergies and medications reviewed and updated. Outpatient Medications Prior to Visit  Medication Sig Dispense Refill   Cholecalciferol (VITAMIN D3) 25 MCG (1000 UT) CAPS Take 1 capsule (1,000 Units total) by mouth daily. 30 capsule    Cyanocobalamin (B-12) 1000 MCG SUBL Place 1 tablet under  the tongue daily.     Menaquinone-7 (VITAMIN K2 PO) Take by mouth daily.     N-ACETYL CYSTEINE PO Take by mouth daily.     linaclotide (LINZESS) 145 MCG CAPS capsule Take 1 capsule (145 mcg total) by mouth daily before breakfast. 30 capsule 5   No facility-administered medications prior to visit.     Per HPI unless specifically indicated in ROS section below Review of Systems  Constitutional:  Negative for activity change, appetite change, chills, fatigue, fever and unexpected weight change.  HENT:  Negative for hearing loss.   Eyes:  Negative for visual disturbance.  Respiratory:  Negative for cough, chest tightness, shortness of breath and wheezing.   Cardiovascular:  Negative for chest pain, palpitations and leg swelling.  Gastrointestinal:  Negative for abdominal distention, abdominal pain, blood in stool, constipation, diarrhea, nausea and vomiting.  Genitourinary:  Negative for difficulty urinating and hematuria.  Musculoskeletal:  Negative for arthralgias, myalgias and neck pain.  Skin:  Negative for rash.  Neurological:  Negative for dizziness, seizures, syncope and headaches.  Hematological:  Negative for adenopathy. Does not bruise/bleed easily.  Psychiatric/Behavioral:  Negative for dysphoric mood. The patient is not nervous/anxious.     Objective:  BP 122/80   Pulse 81   Temp 98.4 F (36.9 C) (Oral)   Ht 5' 6.5" (1.689 m)   Wt 209 lb 6 oz (95 kg)   LMP 01/21/2023  SpO2 98%   BMI 33.29 kg/m   Wt Readings from Last 3 Encounters:  03/04/23 209 lb 6 oz (95 kg)  02/10/23 210 lb 12.8 oz (95.6 kg)  11/03/22 202 lb 6.4 oz (91.8 kg)      Physical Exam Vitals and nursing note reviewed.  Constitutional:      Appearance: Normal appearance. She is not ill-appearing.  HENT:     Head: Normocephalic and atraumatic.     Right Ear: Tympanic membrane, ear canal and external ear normal. There is no impacted cerumen.     Left Ear: Tympanic membrane, ear canal and external ear  normal. There is no impacted cerumen.     Mouth/Throat:     Mouth: Mucous membranes are moist.     Pharynx: Oropharynx is clear. No oropharyngeal exudate or posterior oropharyngeal erythema.  Eyes:     General:        Right eye: No discharge.        Left eye: No discharge.     Extraocular Movements: Extraocular movements intact.     Conjunctiva/sclera: Conjunctivae normal.     Pupils: Pupils are equal, round, and reactive to light.  Neck:     Thyroid: No thyroid mass or thyromegaly.  Cardiovascular:     Rate and Rhythm: Normal rate and regular rhythm.     Pulses: Normal pulses.     Heart sounds: Normal heart sounds. No murmur heard. Pulmonary:     Effort: Pulmonary effort is normal. No respiratory distress.     Breath sounds: Normal breath sounds. No wheezing, rhonchi or rales.  Abdominal:     General: Bowel sounds are normal. There is no distension.     Palpations: Abdomen is soft. There is no mass.     Tenderness: There is no abdominal tenderness. There is no guarding or rebound.     Hernia: No hernia is present.  Musculoskeletal:     Cervical back: Normal range of motion and neck supple. No rigidity.     Right lower leg: No edema.     Left lower leg: No edema.  Lymphadenopathy:     Cervical: No cervical adenopathy.  Skin:    General: Skin is warm and dry.     Findings: No rash.  Neurological:     General: No focal deficit present.     Mental Status: She is alert. Mental status is at baseline.  Psychiatric:        Mood and Affect: Mood normal.        Behavior: Behavior normal.           03/04/2023    8:04 AM 08/19/2022   12:37 PM 07/03/2020   12:14 PM 11/01/2015    1:58 PM  Depression screen PHQ 2/9  Decreased Interest 0 1 3 0  Down, Depressed, Hopeless 0 1 3 0  PHQ - 2 Score 0 2 6 0  Altered sleeping  0 1   Tired, decreased energy  2 3   Change in appetite  0 2   Feeling bad or failure about yourself   2 3   Trouble concentrating  1 3   Moving slowly or  fidgety/restless  0 3   Suicidal thoughts  0 2   PHQ-9 Score  7 23   Difficult doing work/chores  Somewhat difficult         03/04/2023    8:05 AM 08/19/2022   12:37 PM 07/03/2020   12:15 PM  GAD 7 : Generalized  Anxiety Score  Nervous, Anxious, on Edge 0 2 3  Control/stop worrying 0 2 3  Worry too much - different things 1 2 3   Trouble relaxing 0 1 3  Restless 0 0 1  Easily annoyed or irritable 1 2 2   Afraid - awful might happen 0 0 3  Total GAD 7 Score 2 9 18   Anxiety Difficulty Not difficult at all Somewhat difficult    Assessment & Plan:   Problem List Items Addressed This Visit     Health maintenance examination - Primary (Chronic)   Preventative protocols reviewed and updated unless pt declined. Discussed healthy diet and lifestyle.       Relevant Orders   TSH   QuantiFERON-TB Gold Plus   Obesity, Class I, BMI 30-34.9   Encouraged healthy diet and lifestyle coices to affect sustainable weight loss.  Check lipids.       Depression with anxiety   Stable period with reassuring PHQ9/GAD7, denies SI/HI.  Saw psychiatry x1, now only on NAC capsules at night with good effect.  Aware to notify parents and/or seek emergent medical care if worsening mood, any SI/HI.       Vitamin D deficiency   Update levels on 1000 international units  daily      Relevant Orders   VITAMIN D 25 Hydroxy (Vit-D Deficiency, Fractures)   Vitamin B12 deficiency   Update levels on daily.       Relevant Orders   Vitamin B12   Encounter for issuance of medical certificate   Work form filled out Check TB quantiferon gold assay for TB screen.       Other Visit Diagnoses       Encounter for lipid screening for cardiovascular disease       Relevant Orders   Lipid panel        Meds ordered this encounter  Medications   Acetylcysteine (N-ACETYL CYSTEINE) 600 MG CAPS    Sig: Take 2 capsules (1,200 mg total) by mouth at bedtime.    Orders Placed This Encounter  Procedures    Lipid panel   TSH   Vitamin B12   VITAMIN D 25 Hydroxy (Vit-D Deficiency, Fractures)   QuantiFERON-TB Gold Plus    Patient Instructions  Labs today  Form filled out  Good luck with new job!   Follow up plan: No follow-ups on file.  Eustaquio Boyden, MD

## 2023-03-04 NOTE — Assessment & Plan Note (Signed)
Stable period with reassuring PHQ9/GAD7, denies SI/HI.  Saw psychiatry x1, now only on NAC capsules at night with good effect.  Aware to notify parents and/or seek emergent medical care if worsening mood, any SI/HI.

## 2023-03-04 NOTE — Assessment & Plan Note (Signed)
Encouraged healthy diet and lifestyle coices to affect sustainable weight loss.  Check lipids.

## 2023-03-04 NOTE — Assessment & Plan Note (Signed)
Work form filled out Check TB quantiferon gold assay for TB screen.

## 2023-03-04 NOTE — Assessment & Plan Note (Signed)
Update levels on 1000 international units  daily.

## 2023-03-06 LAB — QUANTIFERON-TB GOLD PLUS
Mitogen-NIL: >10 [IU]/mL
NIL: 0.04 [IU]/mL
QuantiFERON-TB Gold Plus: NEGATIVE
TB1-NIL: 0 [IU]/mL
TB2-NIL: 0.01 [IU]/mL

## 2023-03-09 ENCOUNTER — Other Ambulatory Visit: Payer: Self-pay | Admitting: Family Medicine

## 2023-03-09 MED ORDER — B-12 1000 MCG SL SUBL: 1.0000 | SUBLINGUAL_TABLET | SUBLINGUAL | Status: AC

## 2023-03-18 ENCOUNTER — Other Ambulatory Visit: Payer: Self-pay | Admitting: Family Medicine

## 2023-04-02 ENCOUNTER — Encounter: Payer: Self-pay | Admitting: Family Medicine

## 2023-04-02 ENCOUNTER — Ambulatory Visit: Payer: BC Managed Care – PPO | Admitting: Family Medicine

## 2023-04-02 ENCOUNTER — Ambulatory Visit: Payer: Self-pay | Admitting: Family Medicine

## 2023-04-02 VITALS — BP 110/70 | HR 103 | Temp 98.0°F | Ht 66.5 in | Wt 211.5 lb

## 2023-04-02 DIAGNOSIS — H66002 Acute suppurative otitis media without spontaneous rupture of ear drum, left ear: Secondary | ICD-10-CM

## 2023-04-02 DIAGNOSIS — H6123 Impacted cerumen, bilateral: Secondary | ICD-10-CM | POA: Diagnosis not present

## 2023-04-02 MED ORDER — AMOXICILLIN 500 MG PO CAPS: 1000.0000 mg | ORAL_CAPSULE | Freq: Two times a day (BID) | ORAL | 0 refills | Status: DC

## 2023-04-02 NOTE — Progress Notes (Signed)
 Patient ID: Diana Dean, female    DOB: 20-Mar-2000, 23 y.o.   MRN: [id number]  This visit was conducted in person.  BP 110/70 (BP Location: Left Arm, Patient Position: Sitting, Cuff Size: Large)   Pulse (!) 103   Temp 98 F (36.7 C) (Temporal)   Ht 5' 6.5" (1.689 m)   Wt 211 lb 8 oz (95.9 kg)   LMP 03/08/2023   SpO2 98%   BMI 33.63 kg/m    CC:  Chief Complaint  Patient presents with   Nasal Congestion   Ear Pressure    Subjective:   HPI: Diana Dean is a 23 y.o. female presenting on 04/02/2023 for Nasal Congestion and Ear Pressure   Date of onset:  8 days ago  Initial symptoms included  ST, congestion, cough Symptoms progressed to bIlateral ear pressure in 24 hour.   No fever.  No SOB, no wheezing.  Continued but improving cough and runny nose.   No face pain     Sick contacts:  works at daycare COVID testing:   none     She has tried to treat with OTC meds      No history of chronic lung disease such as asthma or COPD. Non-smoker.      Relevant past medical, surgical, family and social history reviewed and updated as indicated. Interim medical history since our last visit reviewed. Allergies and medications reviewed and updated. Outpatient Medications Prior to Visit  Medication Sig Dispense Refill   Acetylcysteine (N-ACETYL CYSTEINE) 600 MG CAPS Take 2 capsules (1,200 mg total) by mouth at bedtime.     Cholecalciferol (VITAMIN D3) 25 MCG (1000 UT) CAPS Take 1 capsule (1,000 Units total) by mouth daily. 30 capsule    Cyanocobalamin (B-12) 1000 MCG SUBL Place 1 tablet under the tongue every Monday, Wednesday, and Friday.     Menaquinone-7 (VITAMIN K2 PO) Take by mouth daily.     No facility-administered medications prior to visit.     Per HPI unless specifically indicated in ROS section below Review of Systems  Constitutional:  Negative for fatigue and fever.  HENT:  Negative for congestion.   Eyes:  Negative for pain.  Respiratory:  Negative  for cough and shortness of breath.   Cardiovascular:  Negative for chest pain, palpitations and leg swelling.  Gastrointestinal:  Negative for abdominal pain.  Genitourinary:  Negative for dysuria and vaginal bleeding.  Musculoskeletal:  Negative for back pain.  Neurological:  Negative for syncope, light-headedness and headaches.  Psychiatric/Behavioral:  Negative for dysphoric mood.    Objective:  BP 110/70 (BP Location: Left Arm, Patient Position: Sitting, Cuff Size: Large)   Pulse (!) 103   Temp 98 F (36.7 C) (Temporal)   Ht 5' 6.5" (1.689 m)   Wt 211 lb 8 oz (95.9 kg)   LMP 03/08/2023   SpO2 98%   BMI 33.63 kg/m   Wt Readings from Last 3 Encounters:  04/02/23 211 lb 8 oz (95.9 kg)  03/04/23 209 lb 6 oz (95 kg)  02/10/23 210 lb 12.8 oz (95.6 kg)      Physical Exam HENT:     Right Ear: There is impacted cerumen.     Left Ear: There is impacted cerumen.     Ears:     Comments: Following irrigation, right TM showed clear fluid, left TM showed yellowish fluid with erythematous TM.      Results for orders placed or performed in visit on 03/04/23  Lipid panel  Collection Time: 03/04/23  8:32 AM  Result Value Ref Range   Cholesterol 109 0 - 200 mg/dL   Triglycerides 41.3 0.0 - 149.0 mg/dL   HDL 24.40 >10.27 mg/dL   VLDL 25.3 0.0 - 66.4 mg/dL   LDL Cholesterol 49 0 - 99 mg/dL   Total CHOL/HDL Ratio 3    NonHDL 66.36   TSH   Collection Time: 03/04/23  8:32 AM  Result Value Ref Range   TSH 2.04 0.35 - 5.50 uIU/mL  Vitamin B12   Collection Time: 03/04/23  8:32 AM  Result Value Ref Range   Vitamin B-12 959 (H) 211 - 911 pg/mL  VITAMIN D 25 Hydroxy (Vit-D Deficiency, Fractures)   Collection Time: 03/04/23  8:32 AM  Result Value Ref Range   VITD 49.97 30.00 - 100.00 ng/mL  QuantiFERON-TB Gold Plus   Collection Time: 03/04/23  8:32 AM  Result Value Ref Range   QuantiFERON-TB Gold Plus NEGATIVE NEGATIVE   NIL 0.04 IU/mL   Mitogen-NIL >10.00 IU/mL   TB1-NIL 0.00  IU/mL   TB2-NIL 0.01 IU/mL    Assessment and Plan  Non-recurrent acute suppurative otitis media of left ear without spontaneous rupture of tympanic membrane Assessment & Plan: Acute, treat with amoxicillin 2 tablets twice daily x 10 days.  Start Flonase 2 sprays per nostril daily, can use decongestant. Recommend rest fluids and time.  Return and ER precautions provided   Bilateral impacted cerumen Assessment & Plan: Cerumen impaction removal via irrigation Performed by : Terese Door Consent for procedure obtained verbally. Bilateral ears lavaged/ irrigated with warm water gently. Pt tolerated procedure well, with no complications. After procedure ears clear of cerumen impaction, with minimal ear canal irritation and redness, no bleeding. TM intact and symptoms improved.    Other orders -     Amoxicillin; Take 2 capsules (1,000 mg total) by mouth 2 (two) times daily.  Dispense: 40 capsule; Refill: 0    Return if symptoms worsen or fail to improve.   Kerby Nora, MD

## 2023-04-02 NOTE — Telephone Encounter (Signed)
  Chief Complaint: earache Symptoms: nasal congestion, earache Frequency: since this past weekend Pertinent Negatives: Patient denies fever, sob, drainage Disposition: [] ED /[] Urgent Care (no appt availability in office) / [x] Appointment(In office/virtual)/ []  [facility]/ [] Home Care/ [] Refused Recommended Disposition /[] [facility]/ []  Follow-up with PCP Additional Notes: Patient's mother reports that patient had a cold last week and while majority of her symptoms have improved, she began experiencing a severe bilateral earache this past weekend. Patient also still has nasal congestion. Per protocol, appt scheduled today 2/20. Patients mother advised to call back with worsening symptoms. Verbalized understanding.     Copied from CRM [id number]. Topic: Clinical - Red Word Triage >> Apr 02, 2023  8:03 AM Gurney Maxin H wrote: [facility] that prompted transfer to Nurse Triage: Really bad pain in ears, congestion, no fever. Reason for Disposition  Ear congestion present > 48 hours  Answer Assessment - Initial Assessment Questions 1. LOCATION: "Which ear is involved?"       bilateral 2. SENSATION: "Describe how the ear feels." (e.g. stuffy, full, plugged)."      full 3. ONSET:  "When did the ear symptoms start?"       This past weekend 4. PAIN: "Do you also have an earache?" If Yes, ask: "How bad is it?" (Scale 1-10; or mild, moderate, severe)     severe 5. CAUSE: "What do you think is causing the ear congestion?"     She had a cold last week 6. URI: "Do you have a runny nose or cough?"      Nasal congestion, mild cough 7. NASAL ALLERGIES: "Are there symptoms of hay fever, such as sneezing or a clear nasal discharge?"  sneezing  Protocols used: Ear - Congestion-A-AH

## 2023-04-02 NOTE — Patient Instructions (Addendum)
 Complete antibiotics.  Start flonase 2 sprays per nostril daily. Can use decongestant. Call if not tolerating antibiotics or if fever on antibiotics.

## 2023-04-02 NOTE — Telephone Encounter (Signed)
 Appt scheduled with Dr. Ermalene Searing today, will route to PCP and Dr. Ermalene Searing

## 2023-04-02 NOTE — Assessment & Plan Note (Signed)
 Acute, treat with amoxicillin 2 tablets twice daily x 10 days.  Start Flonase 2 sprays per nostril daily, can use decongestant. Recommend rest fluids and time.  Return and ER precautions provided

## 2023-04-02 NOTE — Telephone Encounter (Signed)
 Noted

## 2023-04-02 NOTE — Assessment & Plan Note (Signed)
Cerumen impaction removal via irrigation Performed by : Hedy Camara Consent for procedure obtained verbally. Bilateral ears lavaged/ irrigated with warm water gently. Pt tolerated procedure well, with no complications. After procedure ears clear of cerumen impaction, with minimal ear canal irritation and redness, no bleeding. TM intact and symptoms improved.

## 2023-05-08 DIAGNOSIS — R059 Cough, unspecified: Secondary | ICD-10-CM | POA: Diagnosis not present

## 2023-05-08 DIAGNOSIS — J029 Acute pharyngitis, unspecified: Secondary | ICD-10-CM | POA: Diagnosis not present

## 2024-02-05 ENCOUNTER — Telehealth: Payer: Self-pay

## 2024-02-05 NOTE — Telephone Encounter (Signed)
 Called and spoke to mom on dpr. Verified that symptoms started Wednesday. Cough not productive, fever off and on. No history of asthma. Home flu test was positive today for Flu A.  Otc medications are helping symptoms some. [facility]ses [facility].  She is concerned about family that was around for patient during the holiday. You see mom Madeline and grandfather Alm Leventhal dob 10/23/37

## 2024-02-05 NOTE — Telephone Encounter (Addendum)
 ERx for mom and grandfather in their charts.

## 2024-02-05 NOTE — Telephone Encounter (Signed)
 Copied from CRM #[id number]. Topic: Clinical - Medical Advice >> Feb 05, 2024  9:51 AM Nessti S wrote: Reason for CRM: pt mom called because pt took test and has flu a, wanted to know is there anything else she needs to take? Tamiflu ? She would like a call back with recommendations

## 2024-02-05 NOTE — Telephone Encounter (Signed)
 Spoke with pt's Mom norma, she stated she would like Tamiflu  RX for Herself Edita Weyenberg [birth year]/17/1970) and pt's grandpa Charolotte Leventhal dob 10/23/37 ) at [facility]

## 2024-02-05 NOTE — Telephone Encounter (Signed)
 Depending on how she's feeling, she could take tamiflu  treatment dose 1 tab bid x5d. However as she's young, should recover well from influenza without tamiflu  as well.  Would be reasonable to treat older grandfather with tamiflu  preventatively 1 tab daily x10 days and mom Madeline as well if desired.

## 2024-02-08 ENCOUNTER — Ambulatory Visit: Payer: Self-pay

## 2024-02-08 NOTE — Telephone Encounter (Signed)
 FYI Only or Action Required?: FYI only for provider: appointment scheduled on 02/09/24.  Patient was last seen in primary care on 04/02/2023 by Avelina Greig BRAVO, MD.  Called Nurse Triage reporting Influenza and Hemoptysis.  Symptoms began 5 days ago.  Interventions attempted: OTC medications: cough meds, Nyquil.  Symptoms are: stable.  Triage Disposition: See Physician Within 24 Hours  Patient/caregiver understands and will follow disposition?: Yes             Copied from CRM #[id number]. Topic: Clinical - Red Word Triage >> Feb 08, 2024  8:15 AM Olam RAMAN wrote: Red Word that prompted transfer to Nurse Triage: Pt mom on the line stated pt was sick with flu and may be better but now spitting blood Reason for Disposition  Fever present > 3 days (72 hours)  Answer Assessment - Initial Assessment Questions 1. SYMPTOMS: What is your main symptom or concern? (e.g., cough, fever, shortness of breath, muscle aches)     Mother calling in concerned about cough still sounding terrible and coughed up some blood.  2. ONSET: When did the symptoms start?      Wednesday.  3. COUGH: Do you have a cough? If Yes, ask: How bad is the cough?       Yes. Mother concerned due to severe cough and this AM coughed up pea sized dark blood with milky white sputum.  4. FEVER: Do you have a fever? If Yes, ask: What is your temperature, how was it measured, and when did it start?     Yes, Mother states they have been checking with a temporal thermometer. Fevers have been just over 100. She states fevers started Wednesday and have been on and off up until yesterday.  5. BREATHING DIFFICULTY: Are you having any difficulty breathing? (e.g., normal; shortness of breath, wheezing, unable to speak)      No.  6. BETTER-SAME-WORSE: Are you getting better, staying the same or getting worse compared to yesterday?  If getting worse, ask, In what way?     Better.  7. OTHER SYMPTOMS: Do you have  any other symptoms?  (e.g., chills, fatigue, headache, loss of smell or taste, muscle pain, sore throat)     Headaches last week, runny nose with some bloody mucous, chills  8. INFLUENZA EXPOSURE: Was there any known exposure to influenza (flu) before the symptoms began?      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  9. INFLUENZA SUSPECTED: Why do you think you have influenza? (e.g., positive flu self-test at home, symptoms after exposure).     Positive flu self test at home.  10. INFLUENZA VACCINE: Have you had the flu vaccine? If Yes, ask: When did you last get it?       No.  11. HIGH RISK FOR COMPLICATIONS: Do you have any chronic medical problems? (e.g., asthma, heart or lung disease, obesity, weak immune system)       No.  12. PREGNANCY: Is there any chance you are pregnant? When was your last menstrual period?       LMP: 1 week ago.  13. O2 SATURATION MONITOR:  Do you use an oxygen saturation monitor (pulse oximeter) at home? If Yes, ask What is your reading (oxygen level) today? What is your usual oxygen saturation reading? (e.g., 95%)       Not at home.  Mother states the patient has been sick for about 2 months previously and will start to get  better and then will be sick again.  Protocols used: Influenza (Flu) Suspected-A-AH

## 2024-02-08 NOTE — Telephone Encounter (Signed)
 Noted! Thank you

## 2024-02-09 ENCOUNTER — Ambulatory Visit (INDEPENDENT_AMBULATORY_CARE_PROVIDER_SITE_OTHER): Admitting: Family

## 2024-02-09 VITALS — BP 106/70 | HR 116 | Temp 98.5°F | Wt 203.4 lb

## 2024-02-09 DIAGNOSIS — J101 Influenza due to other identified influenza virus with other respiratory manifestations: Secondary | ICD-10-CM

## 2024-02-09 DIAGNOSIS — R051 Acute cough: Secondary | ICD-10-CM

## 2024-02-09 MED ORDER — PROMETHAZINE-DM 6.25-15 MG/5ML PO SYRP: 5.0000 mL | ORAL_SOLUTION | Freq: Four times a day (QID) | ORAL | 0 refills | Status: DC | PRN

## 2024-02-09 NOTE — Progress Notes (Signed)
 "  Acute Office Visit  Subjective:     Patient ID: Diana Dean, female    DOB: 28-May-2000, 23 y.o.   MRN: [id number]  Chief Complaint  Patient presents with   Influenza   Cough    HPI Patient is in today after testing positive for influenza A at home on a home test.  Symptoms started February 03, 2024.  She reports that she is feeling better.  Continues to have a cough that is productive with clear phlegm.  Has had 1 episode of coughing up some phlegm that had blood tinges in it.  However, does report having some bleeding from her nose.  He has been taking NyQuil and DayQuil that helped.  No fever or chills.  Review of Systems  Constitutional:  Positive for chills and fever.  HENT: Negative.    Respiratory:  Positive for cough and sputum production. Negative for shortness of breath and wheezing.   Cardiovascular: Negative.   Gastrointestinal: Negative.   Musculoskeletal: Negative.   Skin: Negative.   Neurological: Negative.   Endo/Heme/Allergies: Negative.   Psychiatric/Behavioral: Negative.      Past Medical History:  Diagnosis Date   Croup 01/2002   History of behavioral and mental health problems 06/2020   behavioral health hospitalization at [facility] IVC 7d for HI, auditory hallucinations started on zyprexa/hydroxyzine   History of recurrent UTIs    Microhematuria 11/22/2015   Reviewed urology notes - normal renal US  and KUB, deemed benign persistent hematuria (2011) Suspect benign TBM (thin) disease given fmhx (father)    Social History   Socioeconomic History   Marital status: Single    Spouse name: Not on file   Number of children: Not on file   Years of education: Not on file   Highest education level: 12th grade  Occupational History   Not on file  Tobacco Use   Smoking status: Never   Smokeless tobacco: Never  Vaping Use   Vaping status: Never Used  Substance and Sexual Activity   Alcohol use: No   Drug use: No   Sexual activity:  Not on file  Other Topics Concern   Not on file  Social History Narrative   Home schooled   Lives with parents [place]mon and [place])   Social Drivers of Health   Tobacco Use: Low Risk (02/09/2024)   Patient History    Smoking Tobacco Use: Never    Smokeless Tobacco Use: Never    Passive Exposure: Not on file  Financial Resource Strain: Patient Declined (02/09/2024)   Overall Financial Resource Strain (CARDIA)    Difficulty of Paying Living Expenses: Patient declined  Food Insecurity: No Food Insecurity (02/09/2024)   Epic    Worried About Programme Researcher, Broadcasting/film/video in the Last Year: Never true    Ran Out of Food in the Last Year: Never true  Transportation Needs: No Transportation Needs (02/09/2024)   Epic    Lack of Transportation (Medical): No    Lack of Transportation (Non-Medical): No  Physical Activity: Inactive (02/09/2024)   Exercise Vital Sign    Days of Exercise per Week: 0 days    Minutes of Exercise per Session: Not on file  Stress: No Stress Concern Present (02/09/2024)   [employer] of Occupational Health - Occupational Stress Questionnaire    Feeling of Stress: Only a little  Social Connections: Moderately Integrated (02/09/2024)   Social Connection and Isolation Panel    Frequency of Communication with Friends and Family: More than three times  a week    Frequency of Social Gatherings with Friends and Family: Once a week    Attends Religious Services: More than 4 times per year    Active Member of Clubs or Organizations: Yes    Attends Banker Meetings: More than 4 times per year    Marital Status: Never married  Intimate Partner Violence: Not on file  Depression (PHQ2-9): Low Risk (02/09/2024)   Depression (PHQ2-9)    PHQ-2 Score: 0  Alcohol Screen: Not on file  Housing: Low Risk (02/09/2024)   Epic    Unable to Pay for Housing in the Last Year: No    Number of Times Moved in the Last Year: 0    Homeless in the Last Year:  No  Utilities: Not on file  Health Literacy: Not on file    Past Surgical History:  Procedure Laterality Date   BIOPSY  01/20/2023   Procedure: BIOPSY;  Surgeon: Therisa Bi, MD;  Location: [facility] ENDOSCOPY;  Service: Gastroenterology;;   COLONOSCOPY WITH PROPOFOL  N/A 01/20/2023   WNL with normal biopsies Romero, Bi, MD);  [facility] ENDOSCOPY   WISDOM TOOTH EXTRACTION      Family History  Problem Relation Age of Onset   Cancer Maternal Grandmother        brain tumor   Hematuria Father        microhematuria    Allergies[1]  Medications Ordered Prior to Encounter[2]  BP 106/70 (BP Location: Right Arm, Patient Position: Sitting, Cuff Size: Normal)   Pulse (!) 116   Temp 98.5 F (36.9 C) (Oral)   Wt 203 lb 6.4 oz (92.3 kg)   SpO2 97%   BMI 32.34 kg/m chart     Objective:    BP 106/70 (BP Location: Right Arm, Patient Position: Sitting, Cuff Size: Normal)   Pulse (!) 116   Temp 98.5 F (36.9 C) (Oral)   Wt 203 lb 6.4 oz (92.3 kg)   SpO2 97%   BMI 32.34 kg/m    Physical Exam Vitals reviewed.  Constitutional:      Appearance: She is obese.  HENT:     Right Ear: Tympanic membrane, ear canal and external ear normal.     Left Ear: Tympanic membrane, ear canal and external ear normal.     Mouth/Throat:     Mouth: Mucous membranes are moist.     Pharynx: Posterior oropharyngeal erythema present. No oropharyngeal exudate.  Cardiovascular:     Rate and Rhythm: Normal rate and regular rhythm.     Pulses: Normal pulses.     Heart sounds: Normal heart sounds.  Pulmonary:     Effort: Pulmonary effort is normal.     Breath sounds: Normal breath sounds.  Musculoskeletal:        General: Normal range of motion.     Cervical back: Normal range of motion and neck supple.  Skin:    General: Skin is warm and dry.  Neurological:     General: No focal deficit present.     Mental Status: She is alert and oriented to person, place, and time. Mental status is at baseline.   Psychiatric:        Mood and Affect: Mood normal.        Behavior: Behavior normal.        Thought Content: Thought content normal.    No results found for any visits on 02/09/24.      Assessment & Plan:   Problem List Items Addressed This Visit  None Visit Diagnoses       Acute cough    -  Primary     Influenza A           Meds ordered this encounter  Medications   promethazine-dextromethorphan (PROMETHAZINE-DM) 6.25-15 MG/5ML syrup    Sig: Take 5 mLs by mouth 4 (four) times daily as needed.    Dispense:  118 mL    Refill:  0  Blood tinged sputum likely related to irritation from the sinus cavity.  No concerns of tuberculosis.  Call the office with any questions or concerns.  Recheck as scheduled or sooner as needed.  No follow-ups on file.  Avenell Sellers B Carder Yin, FNP       [1] Allergies Allergen Reactions   Codeine Nausea Only   Omnicef [Cefdinir] Diarrhea  [2] Current Outpatient Medications on File Prior to Visit  Medication Sig Dispense Refill   Acetylcysteine (N-ACETYL CYSTEINE) 600 MG CAPS Take 2 capsules (1,200 mg total) by mouth at bedtime.     Cholecalciferol (VITAMIN D3) 25 MCG (1000 UT) CAPS Take 1 capsule (1,000 Units total) by mouth daily. 30 capsule    Cyanocobalamin  (B-12) 1000 MCG SUBL Place 1 tablet under the tongue every Monday, Wednesday, and Friday.     Menaquinone-7 (VITAMIN K2 PO) Take by mouth daily.     amoxicillin  (AMOXIL ) 500 MG capsule Take 2 capsules (1,000 mg total) by mouth 2 (two) times daily. (Patient not taking: Reported on 02/09/2024) 40 capsule 0   No current facility-administered medications on file prior to visit.  "

## 2024-03-01 ENCOUNTER — Ambulatory Visit: Payer: Self-pay

## 2024-03-01 NOTE — Telephone Encounter (Signed)
 FYI Only or Action Required?: FYI only for provider: appointment scheduled on 03/02/24.  Patient was last seen in primary care on 02/09/2024 by Douglass Kenney NOVAK, FNP.  Called Nurse Triage reporting Cough.  Symptoms began several weeks ago.  Interventions attempted: OTC medications:  SABRA  Symptoms are: unchanged.  Triage Disposition: See Physician Within 24 Hours  Patient/caregiver understands and will follow disposition?: Yes  Message from Aisha D sent at 03/01/2024 10:43 AM EST  Reason for Triage: Pt's mother Madeline stated that the pt is experiencing a productive cough and green mucus. Pt has been experiencing this issue for about 2 weeks.   Reason for Disposition  SEVERE coughing spells (e.g., whooping sound after coughing, vomiting after coughing)  Answer Assessment - Initial Assessment Questions Pt's mom Madeline states pt has had sx ongoing x 2 weeks and wanting her to be seen. NT her pt's cough and sounds congested and deep pitched. No appts available with PCP, offered appt with [facility] tomorrow 120. Mom confirmed.   1. ONSET: When did the cough begin?      2 weeks 2. SEVERITY: How bad is the cough today?      Gotten worse 3. SPUTUM: Describe the color of your sputum (e.g., none, dry cough; clear, white, yellow, green)     Green mucus 5. DIFFICULTY BREATHING: Are you having difficulty breathing? If Yes, ask: How bad is it? (e.g., mild, moderate, severe)      no 6. FEVER: Do you have a fever? If Yes, ask: What is your temperature, how was it measured, and when did it start?     no 8. LUNG HISTORY: Do you have any history of lung disease?  (e.g., pulmonary embolus, asthma, emphysema)     no 10. OTHER SYMPTOMS: Do you have any other symptoms? (e.g., runny nose, wheezing, chest pain)       faitgue  Protocols used: Cough - Acute Productive-A-AH

## 2024-03-02 ENCOUNTER — Ambulatory Visit: Admitting: Primary Care

## 2024-03-02 ENCOUNTER — Encounter: Payer: Self-pay | Admitting: Primary Care

## 2024-03-02 ENCOUNTER — Ambulatory Visit: Admitting: Nurse Practitioner

## 2024-03-02 VITALS — BP 116/78 | HR 106 | Temp 98.0°F | Ht 66.5 in | Wt 199.5 lb

## 2024-03-02 DIAGNOSIS — R051 Acute cough: Secondary | ICD-10-CM | POA: Diagnosis not present

## 2024-03-02 MED ORDER — AMOXICILLIN-POT CLAVULANATE 875-125 MG PO TABS: 1.0000 | ORAL_TABLET | Freq: Two times a day (BID) | ORAL | 0 refills | Status: AC

## 2024-03-02 NOTE — Patient Instructions (Signed)
 Start Augmentin antibiotics. Take 1 tablet by mouth twice daily for 7 days.  It was a pleasure meeting you!

## 2024-03-02 NOTE — Assessment & Plan Note (Signed)
 Could be viral, but given duration of symptoms, coupled with little improvement, will treat for presumed bacterial infection.  Start Augmentin  antibiotics. Take 1 tablet by mouth twice daily for 7 days.  Continue Dayquil and Nyquil.  Return precautions provided.

## 2024-03-02 NOTE — Telephone Encounter (Addendum)
 Appreciate Mallie seeing pt today.

## 2024-03-02 NOTE — Progress Notes (Signed)
 "  Subjective:    Patient ID: Diana Dean, female    DOB: 26-Nov-2000, 24 y.o.   MRN: [id number]  Diana Dean is a very pleasant 24 y.o. female patient of Dr. Rilla with a history of otitis media who presents today to discuss acute cough.  Evaluated by Kenney, NP on 02/09/24 for acute cough that began 6 days prior and positive home influenza test. During this visit she was treated with promethazine  DM cough syrup and conservative treatment.   Today she discusses onset of cough and nasal congestion which began about 2 weeks ago. She's since developed a productive cough with green sputum and decreased appetite. She denies fevers, chills, body aches. She's taken Dayquil and Nyquil.  She's missed work for the last one week, works in a daycare. Overall she is feeling about the same.    Review of Systems  Constitutional:  Negative for chills and fever.  HENT:  Positive for congestion. Negative for ear pain and sore throat.   Respiratory:  Positive for cough.   Neurological:  Negative for headaches.         Past Medical History:  Diagnosis Date   Croup 01/2002   History of behavioral and mental health problems 06/2020   behavioral health hospitalization at [facility] IVC 7d for HI, auditory hallucinations started on zyprexa/hydroxyzine   History of recurrent UTIs    Microhematuria 11/22/2015   Reviewed urology notes - normal renal US  and KUB, deemed benign persistent hematuria (2011) Suspect benign TBM (thin) disease given fmhx (father)    Social History   Socioeconomic History   Marital status: Single    Spouse name: Not on file   Number of children: Not on file   Years of education: Not on file   Highest education level: 12th grade  Occupational History   Not on file  Tobacco Use   Smoking status: Never   Smokeless tobacco: Never  Vaping Use   Vaping status: Never Used  Substance and Sexual Activity   Alcohol use: No   Drug use: No   Sexual activity: Not on file   Other Topics Concern   Not on file  Social History Narrative   Home schooled   Lives with parents Thermon and [place])   Social Drivers of Health   Tobacco Use: Low Risk (03/02/2024)   Patient History    Smoking Tobacco Use: Never    Smokeless Tobacco Use: Never    Passive Exposure: Not on file  Financial Resource Strain: Patient Declined (02/09/2024)   Overall Financial Resource Strain (CARDIA)    Difficulty of Paying Living Expenses: Patient declined  Food Insecurity: No Food Insecurity (02/09/2024)   Epic    Worried About Programme Researcher, Broadcasting/film/video in the Last Year: Never true    Ran Out of Food in the Last Year: Never true  Transportation Needs: No Transportation Needs (02/09/2024)   Epic    Lack of Transportation (Medical): No    Lack of Transportation (Non-Medical): No  Physical Activity: Inactive (02/09/2024)   Exercise Vital Sign    Days of Exercise per Week: 0 days    Minutes of Exercise per Session: Not on file  Stress: No Stress Concern Present (02/09/2024)   [employer] of Occupational Health - Occupational Stress Questionnaire    Feeling of Stress: Only a little  Social Connections: Moderately Integrated (02/09/2024)   Social Connection and Isolation Panel    Frequency of Communication with Friends and Family: More than three times a  week    Frequency of Social Gatherings with Friends and Family: Once a week    Attends Religious Services: More than 4 times per year    Active Member of Clubs or Organizations: Yes    Attends Engineer, Structural: More than 4 times per year    Marital Status: Never married  Intimate Partner Violence: Not on file  Depression (PHQ2-9): Low Risk (03/02/2024)   Depression (PHQ2-9)    PHQ-2 Score: 0  Alcohol Screen: Not on file  Housing: Low Risk (02/09/2024)   Epic    Unable to Pay for Housing in the Last Year: No    Number of Times Moved in the Last Year: 0    Homeless in the Last Year: No  Utilities: Not on file   Health Literacy: Not on file    Past Surgical History:  Procedure Laterality Date   BIOPSY  01/20/2023   Procedure: BIOPSY;  Surgeon: Therisa Bi, MD;  Location: [facility] ENDOSCOPY;  Service: Gastroenterology;;   COLONOSCOPY WITH PROPOFOL  N/A 01/20/2023   WNL with normal biopsies Romero, Bi, MD);  [facility] ENDOSCOPY   WISDOM TOOTH EXTRACTION      Family History  Problem Relation Age of Onset   Cancer Maternal Grandmother        brain tumor   Hematuria Father        microhematuria    Allergies[1]  Medications Ordered Prior to Encounter[2]  BP 116/78   Pulse (!) 106   Temp 98 F (36.7 C) (Oral)   Ht 5' 6.5 (1.689 m)   Wt 199 lb 8 oz (90.5 kg)   LMP 01/19/2024   SpO2 96%   BMI 31.72 kg/m  Objective:   Physical Exam Cardiovascular:     Rate and Rhythm: Normal rate and regular rhythm.  Pulmonary:     Effort: Pulmonary effort is normal.     Breath sounds: Normal breath sounds.     Comments: Congested cough noted several times during visit. Musculoskeletal:     Cervical back: Neck supple.  Skin:    General: Skin is warm and dry.  Neurological:     Mental Status: She is alert and oriented to person, place, and time.  Psychiatric:        Mood and Affect: Mood normal.     Physical Exam        Assessment & Plan:  Acute cough Assessment & Plan: Could be viral, but given duration of symptoms, coupled with little improvement, will treat for presumed bacterial infection.  Start Augmentin  antibiotics. Take 1 tablet by mouth twice daily for 7 days.  Continue Dayquil and Nyquil.  Return precautions provided.   Orders: -     Amoxicillin -Pot Clavulanate; Take 1 tablet by mouth 2 (two) times daily.  Dispense: 14 tablet; Refill: 0    Assessment and Plan Assessment & Plan         Comer MARLA Gaskins, NP       [1]  Allergies Allergen Reactions   Codeine Nausea Only   Omnicef [Cefdinir] Diarrhea  [2]  Current Outpatient Medications on File Prior to  Visit  Medication Sig Dispense Refill   Acetylcysteine (N-ACETYL CYSTEINE) 600 MG CAPS Take 2 capsules (1,200 mg total) by mouth at bedtime.     Cholecalciferol (VITAMIN D3) 25 MCG (1000 UT) CAPS Take 1 capsule (1,000 Units total) by mouth daily. 30 capsule    Cyanocobalamin  (B-12) 1000 MCG SUBL Place 1 tablet under the tongue every Monday, Wednesday, and Friday.  Menaquinone-7 (VITAMIN K2 PO) Take by mouth daily.     No current facility-administered medications on file prior to visit.   "
